# Patient Record
Sex: Male | Born: 1995 | Race: White | Hispanic: No | Marital: Married | Smoking: Never smoker
Health system: Southern US, Community
[De-identification: ages and names within clinical notes are randomized; demographics above are authoritative.]

## PROBLEM LIST (undated history)

## (undated) ENCOUNTER — Emergency Department: Discharge status: Absent without leave | Payer: 59

---
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
## 2013-12-20 ENCOUNTER — Ambulatory Visit: Payer: Self-pay | Admitting: Emergency Medicine

## 2013-12-20 LAB — RAPID INFLUENZA A&B ANTIGENS (ARMC ONLY)

## 2013-12-20 LAB — RAPID STREP-A WITH REFLX: Micro Text Report: NEGATIVE

## 2013-12-22 LAB — BETA STREP CULTURE(ARMC)

## 2015-12-16 ENCOUNTER — Encounter: Payer: Self-pay | Admitting: *Deleted

## 2015-12-16 ENCOUNTER — Ambulatory Visit
Admission: EM | Admit: 2015-12-16 | Discharge: 2015-12-16 | Disposition: A | Discharge status: Home / Self Care | Payer: 59 | Attending: Family Medicine | Admitting: Family Medicine

## 2015-12-16 DIAGNOSIS — J019 Acute sinusitis, unspecified: Secondary | ICD-10-CM

## 2015-12-16 DIAGNOSIS — H6593 Unspecified nonsuppurative otitis media, bilateral: Secondary | ICD-10-CM

## 2015-12-16 LAB — CBC WITH DIFFERENTIAL/PLATELET
BASOS ABS: 0 10*3/uL (ref 0–0.1)
Basophils Relative: 1 %
Eosinophils Absolute: 0.2 10*3/uL (ref 0–0.7)
Eosinophils Relative: 3 %
HCT: 45.5 % (ref 40.0–52.0)
HEMOGLOBIN: 15.7 g/dL (ref 13.0–18.0)
LYMPHS PCT: 13 %
Lymphs Abs: 1 10*3/uL (ref 1.0–3.6)
MCH: 30.3 pg (ref 26.0–34.0)
MCHC: 34.5 g/dL (ref 32.0–36.0)
MCV: 87.6 fL (ref 80.0–100.0)
MONO ABS: 1.2 10*3/uL — AB (ref 0.2–1.0)
Monocytes Relative: 16 %
NEUTROS ABS: 5.1 10*3/uL (ref 1.4–6.5)
NEUTROS PCT: 67 %
Platelets: 202 10*3/uL (ref 150–440)
RBC: 5.19 MIL/uL (ref 4.40–5.90)
RDW: 13 % (ref 11.5–14.5)
WBC: 7.6 10*3/uL (ref 3.8–10.6)

## 2015-12-16 LAB — RAPID STREP SCREEN (MED CTR MEBANE ONLY): STREPTOCOCCUS, GROUP A SCREEN (DIRECT): NEGATIVE

## 2015-12-16 LAB — MONONUCLEOSIS SCREEN: MONO SCREEN: NEGATIVE

## 2015-12-16 MED ORDER — PSEUDOEPHEDRINE HCL 30 MG PO TABS: 30.0000 mg | ORAL_TABLET | ORAL | Status: AC | PRN

## 2015-12-16 MED ORDER — SALINE SPRAY 0.65 % NA SOLN: 2.0000 | NASAL | Status: DC

## 2015-12-16 MED ORDER — CETIRIZINE HCL 10 MG PO CHEW: 10.0000 mg | CHEWABLE_TABLET | Freq: Every day | ORAL | Status: DC

## 2015-12-16 MED ORDER — AMOXICILLIN-POT CLAVULANATE 875-125 MG PO TABS: 1.0000 | ORAL_TABLET | Freq: Two times a day (BID) | ORAL | Status: AC

## 2015-12-16 MED ORDER — FLUTICASONE PROPIONATE 50 MCG/ACT NA SUSP: 1.0000 | Freq: Two times a day (BID) | NASAL | Status: DC

## 2015-12-16 NOTE — ED Notes (Signed)
Pt states that he has sinus congestion, sore throat, fatigued and facial pain that started about 4 days ago.

## 2015-12-16 NOTE — ED Provider Notes (Signed)
CSN: [id number]     Arrival date & time 12/16/15  1118 History   First MD Initiated Contact with Patient 12/16/15 1145     Chief Complaint  Patient presents with  . Nasal Congestion   (Consider location/radiation/quality/duration/timing/severity/associated sxs/prior Treatment) HPI Comments: Single Caucasian male has tried sudafed, allegra d, benadryl and nasal spray without any relief of symptoms.  Nasacort hasn't helped in the past.  2 doses afrin bedtime  helps with congestion.  + headache occiput to crown, feels like eyes swollen, post nasal drip, mild rare cough, nausea +  Goes to [employer]  Has noticed recurrent rhinitis this past year but previously denied seasonal allergies.  PSHx: denied  FHx mother seasonal allergies  The history is provided by the patient and a friend. No language interpreter was used.    History reviewed. No pertinent past medical history. History reviewed. No pertinent past surgical history. History reviewed. No pertinent family history. Social History  Substance Use Topics  . Smoking status: Never Smoker   . Smokeless tobacco: None  . Alcohol Use: No    Review of Systems  Constitutional: Positive for fatigue. Negative for fever, chills, diaphoresis, activity change, appetite change and unexpected weight change.  HENT: Positive for congestion, postnasal drip, rhinorrhea, sinus pressure and sore throat. Negative for dental problem, drooling, ear discharge, ear pain, facial swelling, hearing loss, mouth sores, nosebleeds, sneezing, tinnitus, trouble swallowing and voice change.   Eyes: Negative for photophobia, pain, discharge, redness, itching and visual disturbance.  Respiratory: Positive for cough. Negative for choking, chest tightness, shortness of breath, wheezing and stridor.   Cardiovascular: Negative for chest pain, palpitations and leg swelling.  Gastrointestinal: Positive for nausea. Negative for vomiting, abdominal pain, diarrhea,  constipation, blood in stool and abdominal distention.  Endocrine: Negative for cold intolerance and heat intolerance.  Genitourinary: Negative for dysuria.  Musculoskeletal: Negative for myalgias, back pain, joint swelling, arthralgias, gait problem, neck pain and neck stiffness.  Skin: Negative for color change, pallor, rash and wound.  Allergic/Immunologic: Positive for environmental allergies. Negative for food allergies and immunocompromised state.  Neurological: Negative for dizziness, tremors, seizures, syncope, facial asymmetry, speech difficulty, weakness, light-headedness, numbness and headaches.  Hematological: Negative for adenopathy. Does not bruise/bleed easily.  Psychiatric/Behavioral: Negative for behavioral problems, confusion, sleep disturbance and agitation.    Allergies  Review of patient's allergies indicates not on file.  Home Medications   Prior to Admission medications   Medication Sig Start Date End Date Taking? Authorizing Provider  amoxicillin-clavulanate (AUGMENTIN) 875-125 MG tablet Take 1 tablet by mouth every 12 (twelve) hours. 12/19/15 12/28/15  Barbaraann Barthel, NP  cetirizine (ZYRTEC) 10 MG chewable tablet Chew 1 tablet (10 mg total) by mouth daily. 12/16/15 12/29/15  Barbaraann Barthel, NP  fluticasone (FLONASE) 50 MCG/ACT nasal spray Place 1 spray into both nostrils 2 (two) times daily. 12/16/15   Barbaraann Barthel, NP  pseudoephedrine (SUDAFED) 30 MG tablet Take 1 tablet (30 mg total) by mouth every 4 (four) hours as needed for congestion (max  or 8 tabs per 24 hours). 12/16/15 12/22/15  Barbaraann Barthel, NP  sodium chloride (OCEAN) 0.65 % SOLN nasal spray Place 2 sprays into both nostrils every 2 (two) hours while awake. 12/16/15   Barbaraann Barthel, NP   Meds Ordered and Administered this Visit  Medications - No data to display  BP 130/85 mmHg  Pulse 91  Temp(Src) 98 F (36.7 C) (Oral)  Ht  (1.905 m)  Wt 250 lb (113.399 kg)  BMI 31.25 kg/m2   SpO2 99% No data found.   Physical Exam  Constitutional: He is oriented to person, place, and time. Vital signs are normal. He appears well-developed and well-nourished. He is active and cooperative.  Non-toxic appearance. He does not have a sickly appearance. He appears ill. No distress.  HENT:  Head: Normocephalic and atraumatic.  Right Ear: Hearing, external ear and ear canal normal. A middle ear effusion is present.  Left Ear: Hearing, external ear and ear canal normal. A middle ear effusion is present.  Nose: Mucosal edema and rhinorrhea present. No nose lacerations, sinus tenderness, nasal deformity, septal deviation or nasal septal hematoma. No epistaxis.  No foreign bodies. Right sinus exhibits no maxillary sinus tenderness and no frontal sinus tenderness. Left sinus exhibits no maxillary sinus tenderness and no frontal sinus tenderness.  Mouth/Throat: Uvula is midline and mucous membranes are normal. Mucous membranes are not pale, not dry and not cyanotic. He does not have dentures. No oral lesions. No trismus in the jaw. Normal dentition. No dental abscesses, uvula swelling, lacerations or dental caries. Posterior oropharyngeal edema and posterior oropharyngeal erythema present. No oropharyngeal exudate or tonsillar abscesses.  Cobblestoning posterior pharynx, bilater tms with air fluid level clear, bilateral nasal turbinates with edema erythema clear discarge, nasal voice  Eyes: Conjunctivae, EOM and lids are normal. Pupils are equal, round, and reactive to light. Right eye exhibits no chemosis, no discharge, no exudate and no hordeolum. No foreign body present in the right eye. Left eye exhibits no chemosis, no discharge, no exudate and no hordeolum. No foreign body present in the left eye. Right conjunctiva is not injected. Right conjunctiva has no hemorrhage. Left conjunctiva is not injected. Left conjunctiva has no hemorrhage. No scleral icterus. Right eye exhibits normal extraocular  motion and no nystagmus. Left eye exhibits normal extraocular motion and no nystagmus. Right pupil is round and reactive. Left pupil is round and reactive. Pupils are equal.  Neck: Trachea normal and normal range of motion. Neck supple. No tracheal tenderness, no spinous process tenderness and no muscular tenderness present. No rigidity. No tracheal deviation, no edema, no erythema and normal range of motion present. No thyroid mass and no thyromegaly present.  Cardiovascular: Normal rate, regular rhythm, S1 normal, S2 normal, normal heart sounds and intact distal pulses.  PMI is not displaced.  Exam reveals no gallop and no friction rub.   No murmur heard. Pulmonary/Chest: Effort normal and breath sounds normal. No stridor. No respiratory distress. He has no decreased breath sounds. He has no wheezes. He has no rhonchi. He has no rales.  Abdominal: Soft. He exhibits no shifting dullness, no distension, no pulsatile liver, no fluid wave, no abdominal bruit, no ascites, no pulsatile midline mass and no mass. Bowel sounds are decreased. There is no hepatosplenomegaly. There is no tenderness. There is no rigidity, no rebound, no guarding, no tenderness at McBurney's point and negative Murphy's sign. Hernia confirmed negative in the ventral area.  Dull to percussion x 4 quads  Musculoskeletal: Normal range of motion. He exhibits no edema or tenderness.  Lymphadenopathy:       Head (right side): No submental, no submandibular, no tonsillar, no preauricular, no posterior auricular and no occipital adenopathy present.       Head (left side): No submental, no submandibular, no tonsillar, no preauricular, no posterior auricular and no occipital adenopathy present.    He has no cervical adenopathy.  Right cervical: No superficial cervical, no deep cervical and no posterior cervical adenopathy present.      Left cervical: No superficial cervical, no deep cervical and no posterior cervical adenopathy present.   Neurological: He is alert and oriented to person, place, and time. He displays no atrophy and no tremor. No cranial nerve deficit or sensory deficit. He exhibits normal muscle tone. He displays no seizure activity. Coordination and gait normal. GCS eye subscore is 4. GCS verbal subscore is 5. GCS motor subscore is 6.  Skin: Skin is warm, dry and intact. No abrasion, no bruising, no burn, no ecchymosis, no laceration, no lesion, no petechiae and no rash noted. He is not diaphoretic. No cyanosis or erythema. No pallor. Nails show no clubbing.  Psychiatric: He has a normal mood and affect. His speech is normal and behavior is normal. Judgment and thought content normal. Cognition and memory are normal.  Nursing note and vitals reviewed.   ED Course  Procedures (including critical care time)  Labs Review Labs Reviewed  CBC WITH DIFFERENTIAL/PLATELET - Abnormal; Notable for the following:    Monocytes Absolute 1.2 (*)    All other components within normal limits  RAPID STREP SCREEN (NOT AT [facility])  CULTURE, GROUP A STREP [facility])  MONONUCLEOSIS SCREEN    Imaging Review No results found.  1245 discussed rapid strep, mononucleosis negative and CBC with slightly elevated monocytes.  Throat culture pending 48 hours will call with results once available.  Patient given copy of lab printouts.  Discussed rest, hydration.  Start augmentin if worsening symptoms, fever greater than 100.55F, eye pain, teeth pain, ear discharge. Patient refused school excuse.  Patient verbalized understanding of information/instructions, agreed with plan of care and had no further questions at this time.  MDM   1. Acute rhinosinusitis   2. Otitis media with effusion, bilateral    Supportive treatment.   No evidence of invasive bacterial infection, non toxic and well hydrated.  This is most likely self limiting viral infection.  I do not see where any further testing or imaging is necessary at this time.   I will suggest  supportive care, rest, good hygiene and encourage the patient to take adequate fluids.  The patient is to return to clinic or EMERGENCY ROOM if symptoms worsen or change significantly e.g. ear pain, fever, purulent discharge from ears or bleeding.  Exitcare handout on otitis media with effusion given to patient.  Patient verbalized agreement and understanding of treatment plan.    Patient notified rapid strep negative.  Suspect Viral illness: no evidence of invasive bacterial infection, non toxic and well hydrated.  This is most likely self limiting viral infection.  I do not see where any further testing or imaging is necessary at this time.   I will suggest supportive care, rest, good hygiene and encourage the patient to take adequate fluids.  Does not require work excuse.  Notified patient staff will call with culture results once available next 48+ hours.  Sudafed  po q4-6h prn; flonase 1 spray each nostril BID prn, nasal saline 1-2 sprays each nostril prn q2h, motrin  po TID prn.  Discussed honey with lemon and salt water gargles for comfort also.   Discussed maximum use afrin in 30 days is 3 days use (6 doses) as rebound swelling possible.  Recommended flonase and saline instead.  The patient is to return to clinic or EMERGENCY ROOM if symptoms worsen or change significantly e.g. fever, lethargy, SOB, wheezing.  Exitcare handout  on rhinitis given to patient.  Patient verbalized agreement and understanding of treatment plan.    No evidence of systemic bacterial infection, non toxic and well hydrated.  I do not see where any further testing or imaging is necessary at this time.   I will suggest supportive care, rest, good hygiene and encourage the patient to take adequate fluids.  The patient is to return to clinic or EMERGENCY ROOM if symptoms worsen or change significantly.  Exitcare handout on sinusitis given to patient.  Patient verbalized agreement and understanding of treatment plan and had no  further questions at this time.   P2:  Hand washing and cover cough  I have recommended clear fluids and bland diet.  Avoid dairy/spicy, fried and large portions of meat while having nausea.  If vomiting hold po intake x 1 hour.  Then sips clear fluids like broths, ginger ale, power ade, gatorade, pedialyte may advance to soft/bland if no vomiting x 24 hours and appetite returned otherwise hydration main focus.     Return to the clinic if symptoms persist or worsen; I have alerted the patient to call if high fever, dehydration, marked weakness, fainting, increased abdominal pain, blood in stool or vomit (red or black).   Exitcare handout on gastroenteritis given to patient. Patient verbalized agreement and understanding of treatment plan and had no further questions at this time.  Barbaraann Barthel, NP 12/16/15 1302

## 2015-12-16 NOTE — Discharge Instructions (Signed)
Do not use afrin for longer than 72 hours as rebound swelling possible  Otitis Media With Effusion Otitis media with effusion is the presence of fluid in the middle ear. This is a common problem in children, which often follows ear infections. It may be present for weeks or longer after the infection. Unlike an acute ear infection, otitis media with effusion refers only to fluid behind the ear drum and not infection. Children with repeated ear and sinus infections and allergy problems are the most likely to get otitis media with effusion. CAUSES  The most frequent cause of the fluid buildup is dysfunction of the eustachian tubes. These are the tubes that drain fluid in the ears to the back of the nose (nasopharynx). SYMPTOMS   The main symptom of this condition is hearing loss. As a result, you or your child may:  Listen to the TV at a loud volume.  Not respond to questions.  Ask "what" often when spoken to.  Mistake or confuse one sound or word for another.  There may be a sensation of fullness or pressure but usually not pain. DIAGNOSIS   Your health care provider will diagnose this condition by examining you or your child's ears.  Your health care provider may test the pressure in you or your child's ear with a tympanometer.  A hearing test may be conducted if the problem persists. TREATMENT   Treatment depends on the duration and the effects of the effusion.  Antibiotics, decongestants, nose drops, and cortisone-type drugs (tablets or nasal spray) may not be helpful.  Children with persistent ear effusions may have delayed language or behavioral problems. Children at risk for developmental delays in hearing, learning, and speech may require referral to a specialist earlier than children not at risk.  You or your child's health care provider may suggest a referral to an ear, nose, and throat surgeon for treatment. The following may help restore normal hearing:  Drainage of  fluid.  Placement of ear tubes (tympanostomy tubes).  Removal of adenoids (adenoidectomy). HOME CARE INSTRUCTIONS   Avoid secondhand smoke.  Infants who are breastfed are less likely to have this condition.  Avoid feeding infants while they are lying flat.  Avoid known environmental allergens.  Avoid people who are sick. SEEK MEDICAL CARE IF:   Hearing is not better in 3 months.  Hearing is worse.  Ear pain.  Drainage from the ear.  Dizziness. MAKE SURE YOU:   Understand these instructions.  Will watch your condition.  Will get help right away if you are not doing well or get worse.   This information is not intended to replace advice given to you by your health care provider. Make sure you discuss any questions you have with your health care provider.   Document Released: 12/11/2004 Document Revised: 11/24/2014 Document Reviewed: 05/31/2013 Elsevier Interactive Patient Education 2016 [employer] Hay Fever Hay fever is an allergic reaction to particles in the air. It cannot be passed from person to person. It cannot be cured, but it can be controlled. CAUSES  Hay fever is caused by something that triggers an allergic reaction (allergens). The following are examples of allergens:  Ragweed.  Feathers.  Animal dander.  Grass and tree pollens.  Cigarette smoke.  House dust.  Pollution. SYMPTOMS   Sneezing.  Runny or stuffy nose.  Tearing eyes.  Itchy eyes, nose, mouth, throat, skin, or other area.  Sore throat.  Headache.  Decreased sense of smell or taste. DIAGNOSIS Your  caregiver will perform a physical exam and ask questions about the symptoms you are having.Allergy testing may be done to determine exactly what triggers your hay fever.  TREATMENT   Over-the-counter medicines may help symptoms. These include:  Antihistamines.  Decongestants. These may help with nasal congestion.  Your caregiver may prescribe medicines if  over-the-counter medicines do not work.  Some people benefit from allergy shots when other medicines are not helpful. HOME CARE INSTRUCTIONS   Avoid the allergen that is causing your symptoms, if possible.  Take all medicine as told by your caregiver. SEEK MEDICAL CARE IF:   You have severe allergy symptoms and your current medicines are not helping.  Your treatment was working at one time, but you are now experiencing symptoms.  You have sinus congestion and pressure.  You develop a fever or headache.  You have thick nasal discharge.  You have asthma and have a worsening cough and wheezing. SEEK IMMEDIATE MEDICAL CARE IF:   You have swelling of your tongue or lips.  You have trouble breathing.  You feel lightheaded or like you are going to faint.  You have cold sweats.  You have a fever.   This information is not intended to replace advice given to you by your health care provider. Make sure you discuss any questions you have with your health care provider.   Document Released: 11/03/2005 Document Revised: 01/26/2012 Document Reviewed: 05/16/2015 Elsevier Interactive Patient Education 2016 [employer] Sinusitis, Adult Sinusitis is redness, soreness, and inflammation of the paranasal sinuses. Paranasal sinuses are air pockets within the bones of your face. They are located beneath your eyes, in the middle of your forehead, and above your eyes. In healthy paranasal sinuses, mucus is able to drain out, and air is able to circulate through them by way of your nose. However, when your paranasal sinuses are inflamed, mucus and air can become trapped. This can allow bacteria and other germs to grow and cause infection. Sinusitis can develop quickly and last only a short time (acute) or continue over a long period (chronic). Sinusitis that lasts for more than 12 weeks is considered chronic. CAUSES Causes of sinusitis include:  Allergies.  Structural abnormalities, such as  displacement of the cartilage that separates your nostrils (deviated septum), which can decrease the air flow through your nose and sinuses and affect sinus drainage.  Functional abnormalities, such as when the small hairs (cilia) that line your sinuses and help remove mucus do not work properly or are not present. SIGNS AND SYMPTOMS Symptoms of acute and chronic sinusitis are the same. The primary symptoms are pain and pressure around the affected sinuses. Other symptoms include:  Upper toothache.  Earache.  Headache.  Bad breath.  Decreased sense of smell and taste.  A cough, which worsens when you are lying flat.  Fatigue.  Fever.  Thick drainage from your nose, which often is green and may contain pus (purulent).  Swelling and warmth over the affected sinuses. DIAGNOSIS Your health care provider will perform a physical exam. During your exam, your health care provider may perform any of the following to help determine if you have acute sinusitis or chronic sinusitis:  Look in your nose for signs of abnormal growths in your nostrils (nasal polyps).  Tap over the affected sinus to check for signs of infection.  View the inside of your sinuses using an imaging device that has a light attached (endoscope). If your health care provider suspects that you have chronic sinusitis,  one or more of the following tests may be recommended:  Allergy tests.  Nasal culture. A sample of mucus is taken from your nose, sent to a lab, and screened for bacteria.  Nasal cytology. A sample of mucus is taken from your nose and examined by your health care provider to determine if your sinusitis is related to an allergy. TREATMENT Most cases of acute sinusitis are related to a viral infection and will resolve on their own within 10 days. Sometimes, medicines are prescribed to help relieve symptoms of both acute and chronic sinusitis. These may include pain medicines, decongestants, nasal steroid  sprays, or saline sprays. However, for sinusitis related to a bacterial infection, your health care provider will prescribe antibiotic medicines. These are medicines that will help kill the bacteria causing the infection. Rarely, sinusitis is caused by a fungal infection. In these cases, your health care provider will prescribe antifungal medicine. For some cases of chronic sinusitis, surgery is needed. Generally, these are cases in which sinusitis recurs more than 3 times per year, despite other treatments. HOME CARE INSTRUCTIONS  Drink plenty of water. Water helps thin the mucus so your sinuses can drain more easily.  Use a humidifier.  Inhale steam 3-4 times a day (for example, sit in the bathroom with the shower running).  Apply a warm, moist washcloth to your face 3-4 times a day, or as directed by your health care provider.  Use saline nasal sprays to help moisten and clean your sinuses.  Take medicines only as directed by your health care provider.  If you were prescribed either an antibiotic or antifungal medicine, finish it all even if you start to feel better. SEEK IMMEDIATE MEDICAL CARE IF:  You have increasing pain or severe headaches.  You have nausea, vomiting, or drowsiness.  You have swelling around your face.  You have vision problems.  You have a stiff neck.  You have difficulty breathing.   This information is not intended to replace advice given to you by your health care provider. Make sure you discuss any questions you have with your health care provider.   Document Released: 11/03/2005 Document Revised: 11/24/2014 Document Reviewed: 11/18/2011 Elsevier Interactive Patient Education [employer]

## 2015-12-18 LAB — CULTURE, GROUP A STREP (THRC)

## 2015-12-20 ENCOUNTER — Telehealth: Payer: Self-pay | Admitting: Family Medicine

## 2015-12-20 NOTE — Telephone Encounter (Signed)
Telephone message left for patient throat culture normal/negative for strep.  Patient to contact clinic if further questions or concerns

## 2016-10-22 DIAGNOSIS — Z23 Encounter for immunization: Secondary | ICD-10-CM | POA: Diagnosis not present

## 2016-12-21 ENCOUNTER — Encounter: Payer: Self-pay | Admitting: Emergency Medicine

## 2016-12-21 ENCOUNTER — Ambulatory Visit
Admission: EM | Admit: 2016-12-21 | Discharge: 2016-12-21 | Disposition: A | Discharge status: Home / Self Care | Payer: 59 | Attending: Emergency Medicine | Admitting: Emergency Medicine

## 2016-12-21 DIAGNOSIS — K1120 Sialoadenitis, unspecified: Secondary | ICD-10-CM | POA: Diagnosis not present

## 2016-12-21 LAB — RAPID STREP SCREEN (MED CTR MEBANE ONLY): STREPTOCOCCUS, GROUP A SCREEN (DIRECT): NEGATIVE

## 2016-12-21 MED ORDER — CEFTRIAXONE SODIUM 1 G IJ SOLR
1.0000 g | Freq: Once | INTRAMUSCULAR | Status: AC
Start: 2016-12-21 — End: 2016-12-21
  Administered 2016-12-21: 1 g via INTRAMUSCULAR

## 2016-12-21 MED ORDER — AMOXICILLIN-POT CLAVULANATE 875-125 MG PO TABS: 1.0000 | ORAL_TABLET | Freq: Two times a day (BID) | ORAL | 0 refills | Status: AC

## 2016-12-21 MED ORDER — ACETAMINOPHEN 500 MG PO TABS
1000.0000 mg | ORAL_TABLET | Freq: Once | ORAL | Status: AC
  Administered 2016-12-21: 1000 mg via ORAL

## 2016-12-21 MED ORDER — IBUPROFEN 800 MG PO TABS: 800.0000 mg | ORAL_TABLET | Freq: Three times a day (TID) | ORAL | 0 refills | Status: AC

## 2016-12-21 NOTE — Discharge Instructions (Signed)
800 mg of ibuprofen with 1 g of Tylenol 3 times a day as needed for pain, fever and swelling. Drink plenty of extra fluids. Try sucking on sour candy such as lemon drops or sour patch kids. Follow-up with one of the ear  nose and throat specialists in several days if not getting better. Go to the ER if you get worse.

## 2016-12-21 NOTE — ED Triage Notes (Signed)
Patient c/o swollen and tender pain on the right side of his neck since yesterday.  Patient denies cold symptoms at this time.

## 2016-12-21 NOTE — ED Provider Notes (Signed)
HPI  SUBJECTIVE:  Travis Vargas is a 21 y.o. male who presents with constant dull pain and swelling inferior to his right jaw starting yesterday. He denies fevers at home but has a fever here. He tried Aleve last night. Symptoms are worse with opening his mouth and swallowing, no alleviating factors. He states that he was "sick last weekend" with flu/cold symptoms, but has been well for the past week. He denies nasal congestion, sinus pain or pressure, rhinorrhea, post nasal drip, dental pain, sore throat, voice changes, neck stiffness, ear pain. The swelling is not associated with eating or salivating. No antipyretic in the past 6-8 hours. He has never had symptoms like this before. He has no history of diabetes, hypertension, immunocompromise. PMD: Dr. Judithann Graves.  History reviewed. No pertinent past medical history.  History reviewed. No pertinent surgical history.  History reviewed. No pertinent family history.  Social History  Substance Use Topics  . Smoking status: Never Smoker  . Smokeless tobacco: Never Used  . Alcohol use No     Current Facility-Administered Medications:  .  cefTRIAXone (ROCEPHIN) injection 1 g, 1 g, Intramuscular, Once, Domenick Gong, MD  Current Outpatient Prescriptions:  .  amoxicillin-clavulanate (AUGMENTIN) 875-125 MG tablet, Take 1 tablet by mouth 2 (two) times daily., Disp: 20 tablet, Rfl: 0 .  ibuprofen (ADVIL,MOTRIN) 800 MG tablet, Take 1 tablet (800 mg total) by mouth 3 (three) times daily., Disp: 30 tablet, Rfl: 0  No Known Allergies   ROS  As noted in HPI.   Physical Exam  BP 125/70 (BP Location: Left Arm)   Pulse (!) 104   Temp (!) 101.2 F (38.4 C) (Oral)   Resp 16   Ht 6\' 3"  (1.905 m)   Wt 250 lb (113.4 kg)   SpO2 99%   BMI 31.25 kg/m   Constitutional: Well developed, well nourished, no acute distress Eyes:  EOMI, conjunctiva normal bilaterally HENT: Normocephalic, atraumatic,mucus membranes moist. Dentition, gingiva  normal, nontender. No expressible purulent drainage from wharton or Stensen's duct. No parotid gland tenderness, swelling. Positive facial swelling, tender mass inferior to the right jaw. No trismus. Normal voice.Tonsils normal, uvula midline. No drooling, stridor  Neck: No neck stiffness, cervical lymphadenopathy Respiratory: Normal inspiratory effort Cardiovascular: Normal rate GI: nondistended skin: No rash, skin intact Musculoskeletal: no deformities Neurologic: Alert & oriented x 3, no focal neuro deficits Psychiatric: Speech and behavior appropriate   ED Course   Medications  cefTRIAXone (ROCEPHIN) injection 1 g (not administered)  acetaminophen (TYLENOL) tablet 1,000 mg (1,000 mg Oral Given 12/21/16 0958)    Orders Placed This Encounter  Procedures  . Rapid strep screen    Standing Status:   Standing    Number of Occurrences:   1  . Culture, group A strep    Standing Status:   Standing    Number of Occurrences:   1    Results for orders placed or performed during the hospital encounter of 12/21/16 (from the past 24 hour(s))  Rapid strep screen     Status: None   Collection Time: 12/21/16  9:51 AM  Result Value Ref Range   Streptococcus, Group A Screen (Direct) NEGATIVE NEGATIVE   No results found.  ED Clinical Impression  Sialadenitis  ED Assessment/Plan  Pt febrile,but appears nontoxic, airway widely patent. Rapid strep negative. Does not appear to be ludwigs angina, Retropharyngeal abscess, buccal abscess, mumps. Does not involve parotid gland. It does not appear to be a dental infection.  Concern for infected submandibular  salivary gland with stone although unable to appreciate stone in ducts on exam or expressible purulent drainage.  Since  patient has fever, plan to give him a gram of Rocephin, home and ibuprofen 800 mg 1 g of Tylenol 3 times a day, Augmentin 875 mg by mouth twice a day for 10 days and will provide follow-up with ENT Dr, Andee PolesVaught on call or Dr.  Elenore RotaJuengel.. Discussed MDM, plan and followup with patient. Discussed sn/sx that should prompt return to the ED. Patient  agrees with plan.   Meds ordered this encounter  Medications  . acetaminophen (TYLENOL) tablet 1,000 mg  . cefTRIAXone (ROCEPHIN) injection 1 g  . amoxicillin-clavulanate (AUGMENTIN) 875-125 MG tablet    Sig: Take 1 tablet by mouth 2 (two) times daily.    Dispense:  20 tablet    Refill:  0  . ibuprofen (ADVIL,MOTRIN) 800 MG tablet    Sig: Take 1 tablet (800 mg total) by mouth 3 (three) times daily.    Dispense:  30 tablet    Refill:  0    *This clinic note was created using Scientist, clinical (histocompatibility and immunogenetics)Dragon dictation software. Therefore, there may be occasional mistakes despite careful proofreading.  ?   Domenick GongAshley Avia Merkley, MD 12/21/16 1739

## 2016-12-22 DIAGNOSIS — K1121 Acute sialoadenitis: Secondary | ICD-10-CM | POA: Diagnosis not present

## 2016-12-24 LAB — CULTURE, GROUP A STREP (THRC)

## 2017-10-28 DIAGNOSIS — H5213 Myopia, bilateral: Secondary | ICD-10-CM | POA: Diagnosis not present

## 2018-09-10 DIAGNOSIS — Z23 Encounter for immunization: Secondary | ICD-10-CM | POA: Diagnosis not present

## 2019-09-27 DIAGNOSIS — Z23 Encounter for immunization: Secondary | ICD-10-CM | POA: Diagnosis not present

## 2019-10-10 ENCOUNTER — Ambulatory Visit
Admission: EM | Admit: 2019-10-10 | Discharge: 2019-10-10 | Disposition: A | Discharge status: Home / Self Care | Payer: 59 | Attending: Family Medicine | Admitting: Family Medicine

## 2019-10-10 ENCOUNTER — Other Ambulatory Visit: Payer: Self-pay

## 2019-10-10 DIAGNOSIS — R0981 Nasal congestion: Secondary | ICD-10-CM

## 2019-10-10 DIAGNOSIS — B349 Viral infection, unspecified: Secondary | ICD-10-CM | POA: Diagnosis not present

## 2019-10-10 DIAGNOSIS — R438 Other disturbances of smell and taste: Secondary | ICD-10-CM | POA: Diagnosis not present

## 2019-10-10 NOTE — ED Triage Notes (Signed)
Pt with nasal congestion starting on Tuesday. No headache, no sore throat, no fever. Saturday his congestion worsened and then couldn't taste or smell. Felt like head was very full.

## 2019-10-10 NOTE — Discharge Instructions (Addendum)
Rest, fluids, tylenol, over the counter medication as needed Await test result

## 2019-10-10 NOTE — ED Provider Notes (Signed)
[facility]    CSN: [id number] Arrival date & time: 10/10/19  1497      History   Chief Complaint Chief Complaint  Patient presents with  . Nasal Congestion    HPI Travis Vargas is a 23 y.o. male.   23 yo male with a c/o nasal congestion for the past 6 days and 3 days of loss of taste and smell. Denies any fevers, chills, chest pain, shortness of breath.      History reviewed. No pertinent past medical history.  There are no active problems to display for this patient.   History reviewed. No pertinent surgical history.     Home Medications    Prior to Admission medications   Medication Sig Start Date End Date Taking? Authorizing Provider  ibuprofen (ADVIL,MOTRIN) 800 MG tablet Take 1 tablet (800 mg total) by mouth 3 (three) times daily. 12/21/16   Domenick Gong, MD    Family History History reviewed. No pertinent family history.  Social History Social History   Tobacco Use  . Smoking status: Never Smoker  . Smokeless tobacco: Never Used  Substance Use Topics  . Alcohol use: Yes    Comment: social  . Drug use: No     Allergies   Patient has no known allergies.   Review of Systems Review of Systems   Physical Exam Triage Vital Signs ED Triage Vitals  Enc Vitals Group     BP 10/10/19 0903 (!) 144/89     Pulse Rate 10/10/19 0903 75     Resp 10/10/19 0903 17     Temp 10/10/19 0903 98.5 F (36.9 C)     Temp Source 10/10/19 0903 Oral     SpO2 10/10/19 0903 100 %     Weight 10/10/19 0905 270 lb (122.5 kg)     Height 10/10/19 0905 6\' 3"  (1.905 m)     Head Circumference --      Peak Flow --      Pain Score 10/10/19 0905 0     Pain Loc --      Pain Edu? --      Excl. in GC? --    No data found.  Updated Vital Signs BP (!) 144/89 (BP Location: Right Arm)   Pulse 75   Temp 98.5 F (36.9 C) (Oral)   Resp 17   Ht 6\' 3"  (1.905 m)   Wt 122.5 kg   SpO2 100%   BMI 33.75 kg/m   Visual Acuity Right Eye Distance:   Left  Eye Distance:   Bilateral Distance:    Right Eye Near:   Left Eye Near:    Bilateral Near:     Physical Exam Vitals signs and nursing note reviewed.  Constitutional:      General: He is not in acute distress.    Appearance: He is not diaphoretic.  Cardiovascular:     Rate and Rhythm: Normal rate.  Pulmonary:     Effort: Pulmonary effort is normal. No respiratory distress.  Neurological:     Mental Status: He is alert.      UC Treatments / Results  Labs (all labs ordered are listed, but only abnormal results are displayed) Labs Reviewed  NOVEL CORONAVIRUS, NAA (HOSP ORDER, SEND-OUT TO REF LAB; TAT 18-24 HRS)    EKG   Radiology No results found.  Procedures Procedures (including critical care time)  Medications Ordered in UC Medications - No data to display  Initial Impression / Assessment and Plan /  UC Course  I have reviewed the triage vital signs and the nursing notes.  Pertinent labs & imaging results that were available during my care of the patient were reviewed by me and considered in my medical decision making (see chart for details).      Final Clinical Impressions(s) / UC Diagnoses   Final diagnoses:  Viral syndrome     Discharge Instructions     Rest, fluids, tylenol, over the counter medication as needed Await test result    ED Prescriptions    None     1. diagnosis reviewed with patient 2. Recommend supportive treatment as above 3. covid test done 4. Follow-up prn if symptoms worsen or don't improve   PDMP not reviewed this encounter.   Norval Gable, MD 10/10/19 [phone]

## 2019-10-12 ENCOUNTER — Telehealth (HOSPITAL_COMMUNITY): Payer: Self-pay | Admitting: Emergency Medicine

## 2019-10-12 LAB — NOVEL CORONAVIRUS, NAA (HOSP ORDER, SEND-OUT TO REF LAB; TAT 18-24 HRS): SARS-CoV-2, NAA: DETECTED — AB

## 2019-10-12 NOTE — Telephone Encounter (Signed)
Positive covid detected on sample. Had extensive conversation about quarantine for himself and his partner. All questions answered.

## 2020-02-13 ENCOUNTER — Ambulatory Visit: Payer: 59 | Admitting: Internal Medicine

## 2020-02-13 ENCOUNTER — Other Ambulatory Visit: Payer: Self-pay

## 2020-02-13 ENCOUNTER — Encounter: Payer: Self-pay | Admitting: Internal Medicine

## 2020-02-13 VITALS — BP 118/70 | HR 70 | Temp 97.8°F | Ht 75.0 in | Wt 278.0 lb

## 2020-02-13 DIAGNOSIS — B356 Tinea cruris: Secondary | ICD-10-CM

## 2020-02-13 DIAGNOSIS — Z6834 Body mass index (BMI) 34.0-34.9, adult: Secondary | ICD-10-CM

## 2020-02-13 MED ORDER — CLOTRIMAZOLE-BETAMETHASONE 1-0.05 % EX CREA: 1.0000 "application " | TOPICAL_CREAM | Freq: Two times a day (BID) | CUTANEOUS | 2 refills | Status: AC

## 2020-02-13 MED ORDER — FLUCONAZOLE 100 MG PO TABS: 100.0000 mg | ORAL_TABLET | ORAL | 0 refills | Status: AC

## 2020-02-13 NOTE — Progress Notes (Signed)
Date:  02/13/2020   Name:  Travis Vargas.   DOB:  1996-06-10   MRN:  [id number]   Chief Complaint: Rash (Jock Itch. Itching. No pain. X 4-5 months. Tried treatments at home that is not helping.) and Establish Care (Re-establishing care. Has been seen in the past but has not been seen in many years.  ) He would like to schedule a CPX and have labs done.  He has no concerns other than a rash.  Rash This is a new problem. The current episode started more than 1 month ago. The problem has been waxing and waning since onset. The affected locations include the groin (and glueal cleft). The rash is characterized by itchiness and redness. He was exposed to nothing. Pertinent negatives include no cough, fatigue or shortness of breath. Treatments tried: jock itch cream. The treatment provided mild relief.    Lab Results  Component Value Date   WBC 7.6 12/16/2015   HGB 15.7 12/16/2015   HCT 45.5 12/16/2015   MCV 87.6 12/16/2015   PLT 202 12/16/2015    Review of Systems  Constitutional: Negative for appetite change, fatigue and unexpected weight change.  Respiratory: Negative for cough, shortness of breath and wheezing.   Cardiovascular: Negative for chest pain, palpitations and leg swelling.  Gastrointestinal: Negative for abdominal pain.  Genitourinary: Negative for dysuria and hematuria.  Skin: Positive for rash. Negative for color change.  Neurological: Negative for tremors, numbness and headaches.  Psychiatric/Behavioral: Negative for dysphoric mood. The patient is not nervous/anxious.     There are no problems to display for this patient.   No Known Allergies  History reviewed. No pertinent surgical history.  Social History   Tobacco Use  . Smoking status: Never Smoker  . Smokeless tobacco: Never Used  Substance Use Topics  . Alcohol use: Yes    Comment: social  . Drug use: No     Medication list has been reviewed and updated.  Current Meds  Medication Sig    . ibuprofen (ADVIL,MOTRIN) 800 MG tablet Take 1 tablet (800 mg total) by mouth 3 (three) times daily.    PHQ 2/9 Scores 02/13/2020  PHQ - 2 Score 0  PHQ- 9 Score 0    BP Readings from Last 3 Encounters:  02/13/20 118/70  10/10/19 (!) 144/89  12/21/16 125/70    Physical Exam Vitals and nursing note reviewed.  Constitutional:      General: He is not in acute distress.    Appearance: Normal appearance. He is well-developed.  HENT:     Head: Normocephalic and atraumatic.  Cardiovascular:     Rate and Rhythm: Normal rate and regular rhythm.     Pulses: Normal pulses.  Pulmonary:     Effort: Pulmonary effort is normal. No respiratory distress.     Breath sounds: No wheezing or rhonchi.  Musculoskeletal:     Cervical back: Normal range of motion.     Right lower leg: No edema.     Left lower leg: No edema.  Lymphadenopathy:     Cervical: No cervical adenopathy.  Skin:    General: Skin is warm and dry.     Capillary Refill: Capillary refill takes less than 2 seconds.     Findings: Rash present.     Comments: Not examined  Neurological:     Mental Status: He is alert and oriented to person, place, and time.  Psychiatric:        Behavior: Behavior normal.  Thought Content: Thought content normal.     Wt Readings from Last 3 Encounters:  02/13/20 278 lb (126.1 kg)  10/10/19 270 lb (122.5 kg)  12/21/16 250 lb (113.4 kg)    BP 118/70   Pulse 70   Temp 97.8 F (36.6 C) (Temporal)   Ht 6\' 3"  (1.905 m)   Wt 278 lb (126.1 kg)   SpO2 98%   BMI 34.75 kg/m   Assessment and Plan: 1. Tinea cruris - clotrimazole-betamethasone (LOTRISONE) cream; Apply 1 application topically 2 (two) times daily.  Dispense: 30 g; Refill: 2 - fluconazole (DIFLUCAN) 100 MG tablet; Take 1 tablet (100 mg total) by mouth every other day for 6 days.  Dispense: 3 tablet; Refill: 0  2. BMI 34.0-34.9,adult Continue efforts at diet and weight loss   Partially dictated using Editor, commissioning.  Any errors are unintentional.  Halina Maidens, MD [facility]  02/13/2020

## 2020-04-27 DIAGNOSIS — Z20828 Contact with and (suspected) exposure to other viral communicable diseases: Secondary | ICD-10-CM | POA: Diagnosis not present

## 2020-07-18 DIAGNOSIS — L308 Other specified dermatitis: Secondary | ICD-10-CM | POA: Diagnosis not present

## 2020-07-18 DIAGNOSIS — D485 Neoplasm of uncertain behavior of skin: Secondary | ICD-10-CM | POA: Diagnosis not present

## 2020-07-18 DIAGNOSIS — L578 Other skin changes due to chronic exposure to nonionizing radiation: Secondary | ICD-10-CM | POA: Diagnosis not present

## 2020-08-13 ENCOUNTER — Ambulatory Visit (INDEPENDENT_AMBULATORY_CARE_PROVIDER_SITE_OTHER): Payer: 59 | Admitting: Internal Medicine

## 2020-08-13 ENCOUNTER — Other Ambulatory Visit: Payer: Self-pay

## 2020-08-13 ENCOUNTER — Encounter: Payer: Self-pay | Admitting: Internal Medicine

## 2020-08-13 VITALS — BP 116/78 | HR 65 | Ht 75.0 in | Wt 267.0 lb

## 2020-08-13 DIAGNOSIS — Z0000 Encounter for general adult medical examination without abnormal findings: Secondary | ICD-10-CM | POA: Diagnosis not present

## 2020-08-13 DIAGNOSIS — Z1159 Encounter for screening for other viral diseases: Secondary | ICD-10-CM | POA: Diagnosis not present

## 2020-08-13 DIAGNOSIS — Z23 Encounter for immunization: Secondary | ICD-10-CM | POA: Diagnosis not present

## 2020-08-13 LAB — POCT URINALYSIS DIPSTICK
Bilirubin, UA: NEGATIVE
Blood, UA: NEGATIVE
Glucose, UA: NEGATIVE
Ketones, UA: NEGATIVE
Leukocytes, UA: NEGATIVE
Nitrite, UA: NEGATIVE
Protein, UA: NEGATIVE
Spec Grav, UA: 1.02 (ref 1.010–1.025)
Urobilinogen, UA: 0.2 E.U./dL
pH, UA: 5 (ref 5.0–8.0)

## 2020-08-13 NOTE — Progress Notes (Signed)
Date:  08/13/2020   Name:  Travis Vargas.   DOB:  1996-09-21   MRN:  [id number]   Chief Complaint: Annual Exam  Travis Vargas. is a 24 y.o. male who presents today for his Complete Annual Exam. He feels well. He reports exercising - running 3 times weekly. He reports he is sleeping fairly well.  He works for the [employer].  He feels that he not getting enough sleep - wakes feeling like he is tired.  No snoring or gasping/apneas.  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   Immunization History  Administered Date(s) Administered  . Influenza, Quadrivalent, Recombinant, Inj, Pf 09/27/2019  . PFIZER SARS-COV-2 Vaccination 02/03/2020, 02/24/2020    HPI  No results found for: CREATININE, BUN, NA, K, CL, CO2 No results found for: CHOL, HDL, LDLCALC, LDLDIRECT, TRIG, CHOLHDL No results found for: TSH No results found for: HGBA1C Lab Results  Component Value Date   WBC 7.6 12/16/2015   HGB 15.7 12/16/2015   HCT 45.5 12/16/2015   MCV 87.6 12/16/2015   PLT 202 12/16/2015   No results found for: ALT, AST, GGT, ALKPHOS, BILITOT   Review of Systems  Constitutional: Negative for appetite change, chills, diaphoresis, fatigue and unexpected weight change.  HENT: Negative for hearing loss, trouble swallowing and voice change.   Eyes: Negative for visual disturbance.  Respiratory: Negative for choking, shortness of breath and wheezing.   Cardiovascular: Negative for chest pain, palpitations and leg swelling.  Gastrointestinal: Negative for abdominal pain, blood in stool, constipation and diarrhea.  Genitourinary: Negative for difficulty urinating, dysuria and frequency.  Musculoskeletal: Negative for arthralgias, back pain and myalgias.  Skin: Negative for color change and rash.  Neurological: Negative for dizziness, syncope and headaches.  Hematological: Negative for adenopathy.    Psychiatric/Behavioral: Negative for dysphoric mood and sleep disturbance (due to work schedule).    Patient Active Problem List   Diagnosis Date Noted  . BMI 34.0-34.9,adult 02/13/2020    No Known Allergies  History reviewed. No pertinent surgical history.  Social History   Tobacco Use  . Smoking status: Never Smoker  . Smokeless tobacco: Never Used  Vaping Use  . Vaping Use: Never used  Substance Use Topics  . Alcohol use: Yes    Comment: social  . Drug use: No     Medication list has been reviewed and updated.  Current Meds  Medication Sig  . clotrimazole-betamethasone (LOTRISONE) cream Apply 1 application topically 2 (two) times daily.  Marland Kitchen ibuprofen (ADVIL,MOTRIN) 800 MG tablet Take 1 tablet (800 mg total) by mouth 3 (three) times daily.    PHQ 2/9 Scores 02/13/2020  PHQ - 2 Score 0  PHQ- 9 Score 0    No flowsheet data found.  BP Readings from Last 3 Encounters:  08/13/20 116/78  02/13/20 118/70  10/10/19 (!) 144/89    Physical Exam Vitals and nursing note reviewed.  Constitutional:      Appearance: Normal appearance. He is well-developed.  HENT:     Head: Normocephalic.     Right Ear: Tympanic membrane, ear canal and external ear normal.     Left Ear: Tympanic membrane, ear canal and external ear normal.     Nose: Nose normal.  Eyes:     Conjunctiva/sclera: Conjunctivae normal.     Pupils: Pupils are equal, round, and reactive to light.  Neck:  Thyroid: No thyromegaly.     Vascular: No carotid bruit.  Cardiovascular:     Rate and Rhythm: Normal rate and regular rhythm.     Pulses: Normal pulses.     Heart sounds: Normal heart sounds. No murmur heard.   Pulmonary:     Effort: Pulmonary effort is normal.     Breath sounds: Normal breath sounds. No wheezing.  Chest:     Breasts:        Right: No mass.        Left: No mass.  Abdominal:     General: Bowel sounds are normal.     Palpations: Abdomen is soft.     Tenderness: There is no  abdominal tenderness.  Musculoskeletal:        General: Normal range of motion.     Cervical back: Normal range of motion and neck supple.     Right lower leg: No edema.     Left lower leg: No edema.  Lymphadenopathy:     Cervical: No cervical adenopathy.  Skin:    General: Skin is warm and dry.     Capillary Refill: Capillary refill takes less than 2 seconds.  Neurological:     General: No focal deficit present.     Mental Status: He is alert and oriented to person, place, and time.     Deep Tendon Reflexes: Reflexes are normal and symmetric.  Psychiatric:        Mood and Affect: Mood normal.        Speech: Speech normal.        Behavior: Behavior normal.     Wt Readings from Last 3 Encounters:  08/13/20 267 lb (121.1 kg)  02/13/20 278 lb (126.1 kg)  10/10/19 270 lb (122.5 kg)    BP 116/78   Pulse 65   Ht 6\' 3"  (1.905 m)   Wt 267 lb (121.1 kg)   SpO2 98%   BMI 33.37 kg/m   Assessment and Plan: 1. Annual physical exam Continue healthy diet and exercise Has lost 9 lbs since last visit Needs continuous restful sleep but this is not possible due to work - CBC with Differential/Platelet - Comprehensive metabolic panel - Lipid panel - POCT urinalysis dipstick  2. Need for hepatitis C screening test - Hepatitis C antibody   Partially dictated using . Any errors are unintentional.  Animal nutritionist, MD [facility]  08/13/2020

## 2020-08-14 ENCOUNTER — Encounter: Payer: Self-pay | Admitting: Internal Medicine

## 2020-08-14 DIAGNOSIS — E782 Mixed hyperlipidemia: Secondary | ICD-10-CM | POA: Insufficient documentation

## 2020-08-14 LAB — COMPREHENSIVE METABOLIC PANEL
ALT: 21 IU/L (ref 0–44)
AST: 17 IU/L (ref 0–40)
Albumin/Globulin Ratio: 2 (ref 1.2–2.2)
Albumin: 4.9 g/dL (ref 4.1–5.2)
Alkaline Phosphatase: 84 IU/L (ref 44–121)
BUN/Creatinine Ratio: 8 — ABNORMAL LOW (ref 9–20)
BUN: 8 mg/dL (ref 6–20)
Bilirubin Total: 0.3 mg/dL (ref 0.0–1.2)
CO2: 25 mmol/L (ref 20–29)
Calcium: 9.7 mg/dL (ref 8.7–10.2)
Chloride: 102 mmol/L (ref 96–106)
Creatinine, Ser: 0.99 mg/dL (ref 0.76–1.27)
GFR calc Af Amer: 122 mL/min/{1.73_m2} (ref >59)
GFR calc non Af Amer: 105 mL/min/{1.73_m2} (ref >59)
Globulin, Total: 2.5 g/dL (ref 1.5–4.5)
Glucose: 102 mg/dL — ABNORMAL HIGH (ref 65–99)
Potassium: 4.4 mmol/L (ref 3.5–5.2)
Sodium: 140 mmol/L (ref 134–144)
Total Protein: 7.4 g/dL (ref 6.0–8.5)

## 2020-08-14 LAB — CBC WITH DIFFERENTIAL/PLATELET
Basophils Absolute: 0 10*3/uL (ref 0.0–0.2)
Basos: 1 %
EOS (ABSOLUTE): 0.1 10*3/uL (ref 0.0–0.4)
Eos: 3 %
Hematocrit: 50.5 % (ref 37.5–51.0)
Hemoglobin: 17.5 g/dL (ref 13.0–17.7)
Immature Grans (Abs): 0 10*3/uL (ref 0.0–0.1)
Immature Granulocytes: 0 %
Lymphocytes Absolute: 1.2 10*3/uL (ref 0.7–3.1)
Lymphs: 24 %
MCH: 31.4 pg (ref 26.6–33.0)
MCHC: 34.7 g/dL (ref 31.5–35.7)
MCV: 91 fL (ref 79–97)
Monocytes Absolute: 0.6 10*3/uL (ref 0.1–0.9)
Monocytes: 12 %
Neutrophils Absolute: 3 10*3/uL (ref 1.4–7.0)
Neutrophils: 60 %
Platelets: 260 10*3/uL (ref 150–450)
RBC: 5.57 x10E6/uL (ref 4.14–5.80)
RDW: 12.1 % (ref 11.6–15.4)
WBC: 4.9 10*3/uL (ref 3.4–10.8)

## 2020-08-14 LAB — LIPID PANEL
Chol/HDL Ratio: 6.4 ratio — ABNORMAL HIGH (ref 0.0–5.0)
Cholesterol, Total: 185 mg/dL (ref 100–199)
HDL: 29 mg/dL — ABNORMAL LOW (ref >39)
LDL Chol Calc (NIH): 127 mg/dL — ABNORMAL HIGH (ref 0–99)
Triglycerides: 161 mg/dL — ABNORMAL HIGH (ref 0–149)
VLDL Cholesterol Cal: 29 mg/dL (ref 5–40)

## 2020-08-14 LAB — HEPATITIS C ANTIBODY: Hep C Virus Ab: <0.1 s/co ratio (ref 0.0–0.9)

## 2020-11-21 DIAGNOSIS — L408 Other psoriasis: Secondary | ICD-10-CM | POA: Diagnosis not present

## 2020-11-27 ENCOUNTER — Other Ambulatory Visit: Payer: Self-pay

## 2020-11-27 ENCOUNTER — Ambulatory Visit (INDEPENDENT_AMBULATORY_CARE_PROVIDER_SITE_OTHER): Payer: 59 | Admitting: Internal Medicine

## 2020-11-27 ENCOUNTER — Encounter: Payer: Self-pay | Admitting: Internal Medicine

## 2020-11-27 VITALS — BP 106/86 | HR 72 | Temp 98.0°F | Ht 75.0 in | Wt 275.0 lb

## 2020-11-27 DIAGNOSIS — S46912A Strain of unspecified muscle, fascia and tendon at shoulder and upper arm level, left arm, initial encounter: Secondary | ICD-10-CM | POA: Diagnosis not present

## 2020-11-27 NOTE — Progress Notes (Signed)
Date:  11/27/2020   Name:  Travis Vargas.   DOB:  1996-01-23   MRN:  [id number]   Chief Complaint: Shoulder Pain (X1 day, left shoulder, constant pain, loading cabinets into a house yesterday, hurts to move arm up )  Shoulder Injury  The incident occurred at home. The left shoulder is affected. The incident occurred 12 to 24 hours ago. Injury mechanism: carrying heavy cabinets. The quality of the pain is described as aching. The pain does not radiate. The pain is moderate. Associated symptoms include tingling (during the night while sleeping on his back). Pertinent negatives include no chest pain, muscle weakness or numbness. He has tried ice for the symptoms. The treatment provided mild relief.    Lab Results  Component Value Date   CREATININE 0.99 08/13/2020   BUN 8 08/13/2020   NA 140 08/13/2020   K 4.4 08/13/2020   CL 102 08/13/2020   CO2 25 08/13/2020   Lab Results  Component Value Date   CHOL 185 08/13/2020   HDL 29 (L) 08/13/2020   LDLCALC 127 (H) 08/13/2020   TRIG 161 (H) 08/13/2020   CHOLHDL 6.4 (H) 08/13/2020   No results found for: TSH No results found for: HGBA1C Lab Results  Component Value Date   WBC 4.9 08/13/2020   HGB 17.5 08/13/2020   HCT 50.5 08/13/2020   MCV 91 08/13/2020   PLT 260 08/13/2020   Lab Results  Component Value Date   ALT 21 08/13/2020   AST 17 08/13/2020   ALKPHOS 84 08/13/2020   BILITOT 0.3 08/13/2020     Review of Systems  Respiratory: Negative for chest tightness and shortness of breath.   Cardiovascular: Negative for chest pain and palpitations.  Musculoskeletal: Positive for arthralgias and myalgias. Negative for gait problem, joint swelling, neck pain and neck stiffness.  Neurological: Positive for tingling (during the night while sleeping on his back). Negative for dizziness, weakness and numbness.    Patient Active Problem List   Diagnosis Date Noted  . Mixed hyperlipidemia 08/14/2020  . BMI 34.0-34.9,adult  02/13/2020    No Known Allergies  History reviewed. No pertinent surgical history.  Social History   Tobacco Use  . Smoking status: Never Smoker  . Smokeless tobacco: Never Used  Vaping Use  . Vaping Use: Never used  Substance Use Topics  . Alcohol use: Yes    Comment: social  . Drug use: No     Medication list has been reviewed and updated.  Current Meds  Medication Sig  . clotrimazole-betamethasone (LOTRISONE) cream Apply 1 application topically 2 (two) times daily.  Marland Kitchen ibuprofen (ADVIL,MOTRIN) 800 MG tablet Take 1 tablet (800 mg total) by mouth 3 (three) times daily.    PHQ 2/9 Scores 11/27/2020 02/13/2020  PHQ - 2 Score 0 0  PHQ- 9 Score 0 0    GAD 7 : Generalized Anxiety Score 11/27/2020  Nervous, Anxious, on Edge 0  Control/stop worrying 0  Worry too much - different things 0  Trouble relaxing 0  Restless 0  Easily annoyed or irritable 0  Afraid - awful might happen 0  Total GAD 7 Score 0    BP Readings from Last 3 Encounters:  11/27/20 106/86  08/13/20 116/78  02/13/20 118/70    Physical Exam Vitals and nursing note reviewed.  Constitutional:      General: He is not in acute distress.    Appearance: He is well-developed.  HENT:     Head: Normocephalic  and atraumatic.  Cardiovascular:     Rate and Rhythm: Normal rate and regular rhythm.  Pulmonary:     Effort: Pulmonary effort is normal. No respiratory distress.     Breath sounds: No wheezing or rhonchi.  Musculoskeletal:     Right shoulder: Normal.     Left shoulder: No swelling or bony tenderness. Decreased range of motion. Normal strength. Normal pulse.     Cervical back: Normal range of motion and neck supple. No rigidity or tenderness.     Comments: Discomfort with abduction past 45 degrees Discomfort with external rotation of shoulder against resistance  Skin:    General: Skin is warm and dry.     Findings: No rash.  Neurological:     Mental Status: He is alert and oriented to person,  place, and time.  Psychiatric:        Mood and Affect: Mood and affect normal.        Behavior: Behavior normal.        Thought Content: Thought content normal.     Wt Readings from Last 3 Encounters:  11/27/20 275 lb (124.7 kg)  08/13/20 267 lb (121.1 kg)  02/13/20 278 lb (126.1 kg)    BP 106/86   Pulse 72   Temp 98 F (36.7 C) (Oral)   Ht 6\' 3"  (1.905 m)   Wt 275 lb (124.7 kg)   SpO2 97%   BMI 34.37 kg/m   Assessment and Plan: 1. Strain of left shoulder, initial encounter Due to heavy lifting without specific incident Continue Ice for today then heat Ibuprofen 600 mg tid Out of work for the next 2 days   Partially dictated using . Any errors are unintentional.  Animal nutritionist, MD [facility]  11/27/2020

## 2020-11-27 NOTE — Patient Instructions (Signed)
Ibuprofen 600 mg three times a day  Ice for the first day or two then heat

## 2021-01-03 DIAGNOSIS — L408 Other psoriasis: Secondary | ICD-10-CM | POA: Diagnosis not present

## 2021-01-03 DIAGNOSIS — L738 Other specified follicular disorders: Secondary | ICD-10-CM | POA: Diagnosis not present

## 2021-01-03 DIAGNOSIS — L304 Erythema intertrigo: Secondary | ICD-10-CM | POA: Diagnosis not present

## 2021-01-10 ENCOUNTER — Other Ambulatory Visit: Payer: Self-pay

## 2021-01-10 ENCOUNTER — Telehealth: Payer: Self-pay | Admitting: Physician Assistant

## 2021-01-10 ENCOUNTER — Ambulatory Visit: Payer: Self-pay | Admitting: *Deleted

## 2021-01-10 ENCOUNTER — Ambulatory Visit
Admission: EM | Admit: 2021-01-10 | Discharge: 2021-01-10 | Disposition: A | Discharge status: Home / Self Care | Payer: 59 | Attending: Physician Assistant | Admitting: Physician Assistant

## 2021-01-10 ENCOUNTER — Encounter: Payer: Self-pay | Admitting: Emergency Medicine

## 2021-01-10 ENCOUNTER — Ambulatory Visit (INDEPENDENT_AMBULATORY_CARE_PROVIDER_SITE_OTHER)
Admit: 2021-01-10 | Discharge: 2021-01-10 | Disposition: A | Discharge status: Home / Self Care | Payer: 59 | Attending: Sports Medicine | Admitting: Sports Medicine

## 2021-01-10 DIAGNOSIS — G8929 Other chronic pain: Secondary | ICD-10-CM | POA: Diagnosis not present

## 2021-01-10 DIAGNOSIS — Z87828 Personal history of other (healed) physical injury and trauma: Secondary | ICD-10-CM | POA: Diagnosis not present

## 2021-01-10 DIAGNOSIS — R519 Headache, unspecified: Secondary | ICD-10-CM

## 2021-01-10 LAB — GLUCOSE, CAPILLARY: Glucose-Capillary: 95 mg/dL (ref 70–99)

## 2021-01-10 MED ORDER — DICLOFENAC SODIUM 75 MG PO TBEC: 75.0000 mg | DELAYED_RELEASE_TABLET | Freq: Two times a day (BID) | ORAL | 0 refills | Status: AC

## 2021-01-10 NOTE — ED Triage Notes (Signed)
Pt c/o headaches. Started about a month ago. He states he hit his head while snowboarding back in January and cracked a helmet. Since then he has been getting headache after and during work outs and sneezing. He states this happened yesterday and he still has the headache today.

## 2021-01-10 NOTE — ED Notes (Signed)
No auth required for in network. Reference number [id number]

## 2021-01-10 NOTE — Telephone Encounter (Addendum)
Pt called in co a severe headache that happened while he was working out in [employer]   "It was so bad it almost brought me to my knees".   He has had it all night and this morning.  He has taken Tylenol with some relief.    On occasion he gets exertional headaches with sneezing and working out but nothing like last night.   This headache is worse and not going away. Denies dizziness, visual changes, confusion, fainting.   Does c/o a stiff neck after last night.  He did have a snow boarding accident in Jan. Where he fell and hit his head hard enough to crack his helmet.   He wasn't seen after that incident.   Denied any problems after the accident.   He was able to get up and go on down the hill without a problem.   "It rang my bell right after it happened but no loss of consciousness or headaches since".     There are no appt available with Dr. Judithann Graves at [facility].   Protocol is to be seen within 4 hours.  I have sent a high priority note to the office for them to review for a possible work in.    I encouraged him not to do any work outs until evaluated.   He was agreeable.  He can be reached at [phone].  Covid questionnaire completed.   The headache indicated a virtual visit however this headache is not covid related so he is requesting to be seen in the office for this issue.  I called into the office and let them know about his issue.   They are going to call him back.    Reason for Disposition . [1] SEVERE headache (e.g., excruciating) AND [2] not improved after 2 hours of pain medicine  Answer Assessment - Initial Assessment Questions 1. LOCATION: "Where does it hurt?"      I've been going to the gym for last couple of weeks.  I'm training hard in the gym.   I get occasional headaches.  2. ONSET: "When did the headache start?" (Minutes, hours or days)      Last night I got a sudden bad headache while in the gym.  It's been there since even this morning. I will  sneeze and I get a bad headache that changes every so often. I'm concerned about this bad a headache.   Back in Jan. I was snow boarding and fell and hit my head really hard.   I cracked my helmet.   No CT scans or seen after the fall. I don't get headaches.   3. PATTERN: "Does the pain come and go, or has it been constant since it started?"     Last night it radiated down into my neck in the back and around my neck.    My neck is tight this morning. 4. SEVERITY: "How bad is the pain?" and "What does it keep you from doing?"  (e.g., Scale 1-10; mild, moderate, or severe)   - MILD (1-3): doesn't interfere with normal activities    - MODERATE (4-7): interferes with normal activities or awakens from sleep    - SEVERE (8-10): excruciating pain, unable to do any normal activities        Severe last night.   Sudden headache that almost brought me to my knees.  5. RECURRENT SYMPTOM: "Have you ever had headaches before?" If Yes, ask: "When was the last time?"  and "What happened that time?"      No headaches before.   The headaches are stressed induced like with lifting weights or sneezing.   But last night that headache was bad and is still there.   No dizziness or visual changes Back in Jan. Just starting to work out so don't know if there is any relationship from the snow board accident.   When I fell it "rang my bell when it happened" the accident.  I got up and went down the hill without a problem. 6. CAUSE: "What do you think is causing the headache?"     I don't know   Triggered by exertion  7. MIGRAINE: "Have you been diagnosed with migraine headaches?" If Yes, ask: "Is this headache similar?"      No  8. HEAD INJURY: "Has there been any recent injury to the head?"      Yes in Jan.  See above 9. OTHER SYMPTOMS: "Do you have any other symptoms?" (fever, stiff neck, eye pain, sore throat, cold symptoms)     Stiff neck this morning.   10. PREGNANCY: "Is there any chance you are pregnant?" "When  was your last menstrual period?"       N/A  Protocols used: HEADACHE-A-AH

## 2021-01-10 NOTE — ED Provider Notes (Signed)
[facility]    CSN: [id number] Arrival date & time: 01/10/21  1059      History   Chief Complaint Chief Complaint  Patient presents with  . Headache    HPI Travis Vargas. is a 25 y.o. male presenting for headaches x2 months. Patient states that at the beginning of January he got into a snowboarding accident and fell off of his snowboard and hit his head on the ground. He says he was wearing a helmet but the helmet cracked. He denies any loss of consciousness but states he has had headaches nearly every day since. Patient says that the headaches seem to come on whenever he is working out heavily exerting himself. Patient says that he does a lot of weightlifting. He says that he works out 6 days a week and gets a headache nearly every time he works out. He also says sometimes sneezing causes him to have a headache. He admits to some pain in the upper part of his neck as well. No real pain with movement of the neck. Patient denies any associated dizziness, weakness, vision changes, nausea/vomiting, balance or speech difficulty, facial drooping, numbness/tingling, sleep disturbances, fatigue or irritability. Patient does admit that he has had "exertional headaches" in the past before he had his head injury but they seem to be worse since. He is taken Tylenol when he gets these headaches but says it has not really helped. He currently has a headache of the top of his head and upper neck. Patient not taking any anticoagulants. He denies any significant medical history. Not taking any routine medication. He has no other complaints or concerns.  HPI  History reviewed. No pertinent past medical history.  Patient Active Problem List   Diagnosis Date Noted  . Mixed hyperlipidemia 08/14/2020  . BMI 34.0-34.9,adult 02/13/2020    History reviewed. No pertinent surgical history.     Home Medications    Prior to Admission medications   Medication Sig Start Date End Date Taking?  Authorizing Provider  clotrimazole-betamethasone (LOTRISONE) cream Apply 1 application topically 2 (two) times daily. 02/13/20   Reubin Milan, MD  Halobetasol Propionate (LEXETTE) 0.05 % FOAM Apply topically. 11/21/20   [provider]  ibuprofen (ADVIL,MOTRIN) 800 MG tablet Take 1 tablet (800 mg total) by mouth 3 (three) times daily. 12/21/16   Domenick Gong, MD  triamcinolone (KENALOG) 0.025 % cream Apply topically. 11/21/20   [provider]    Family History Family History  Problem Relation Age of Onset  . Breast cancer Mother   . Diabetes Father     Social History Social History   Tobacco Use  . Smoking status: Never Smoker  . Smokeless tobacco: Never Used  Vaping Use  . Vaping Use: Never used  Substance Use Topics  . Alcohol use: Yes    Comment: social  . Drug use: No     Allergies   Patient has no known allergies.   Review of Systems Review of Systems  Constitutional: Negative for fatigue.  HENT: Negative for congestion.   Eyes: Negative for photophobia, pain and visual disturbance.  Respiratory: Negative for shortness of breath.   Gastrointestinal: Negative for nausea and vomiting.  Musculoskeletal: Negative for arthralgias and neck pain.  Skin: Negative for wound.  Neurological: Positive for headaches. Negative for dizziness, tremors, seizures, syncope, facial asymmetry, speech difficulty, weakness, light-headedness and numbness.  Psychiatric/Behavioral: Negative for confusion, dysphoric mood and sleep disturbance. The patient is not nervous/anxious.  Physical Exam Triage Vital Signs ED Triage Vitals  Enc Vitals Group     BP 01/10/21 1113 119/82     Pulse Rate 01/10/21 1113 71     Resp 01/10/21 1113 18     Temp 01/10/21 1113 98.1 F (36.7 C)     Temp Source 01/10/21 1113 Oral     SpO2 01/10/21 1113 98 %     Weight 01/10/21 1111 260 lb (117.9 kg)     Height 01/10/21 1111 6\' 3"  (1.905 m)     Head Circumference --      Peak  Flow --      Pain Score 01/10/21 1111 3     Pain Loc --      Pain Edu? --      Excl. in GC? --    No data found.  Updated Vital Signs BP 119/82 (BP Location: Left Arm)   Pulse 71   Temp 98.1 F (36.7 C) (Oral)   Resp 18   Ht 6\' 3"  (1.905 m)   Wt 260 lb (117.9 kg)   SpO2 98%   BMI 32.50 kg/m       Physical Exam Vitals and nursing note reviewed.  Constitutional:      General: He is not in acute distress.    Appearance: Normal appearance. He is well-developed, normal weight and well-nourished. He is not ill-appearing or diaphoretic.  HENT:     Head: Normocephalic and atraumatic.     Right Ear: Tympanic membrane, ear canal and external ear normal.     Left Ear: Tympanic membrane, ear canal and external ear normal.     Nose: Nose normal.     Mouth/Throat:     Mouth: Mucous membranes are moist.     Pharynx: Oropharynx is clear.  Eyes:     General: No scleral icterus.       Right eye: No discharge.        Left eye: No discharge.     Extraocular Movements: Extraocular movements intact.     Conjunctiva/sclera: Conjunctivae normal.     Pupils: Pupils are equal, round, and reactive to light.  Cardiovascular:     Rate and Rhythm: Normal rate and regular rhythm.     Heart sounds: Normal heart sounds.  Pulmonary:     Effort: Pulmonary effort is normal. No respiratory distress.     Breath sounds: Normal breath sounds.  Musculoskeletal:        General: No edema.     Cervical back: Neck supple.  Skin:    General: Skin is warm and dry.  Neurological:     General: No focal deficit present.     Mental Status: He is alert and oriented to person, place, and time. Mental status is at baseline.     Cranial Nerves: No cranial nerve deficit.     Sensory: Sensation is intact. No sensory deficit.     Motor: No weakness.     Coordination: Coordination normal. Finger-Nose-Finger Test and Heel to Shin Test normal.     Gait: Gait normal.     Comments: Slight difficulty with shallow knee  bend  Psychiatric:        Mood and Affect: Mood and affect and mood normal.        Behavior: Behavior normal.        Thought Content: Thought content normal.      UC Treatments / Results  Labs (all labs ordered are listed, but only abnormal results are displayed) Labs  Reviewed  GLUCOSE, CAPILLARY  CBG MONITORING, ED    EKG   Radiology No results found.  Procedures Procedures (including critical care time)  Medications Ordered in UC Medications - No data to display  Initial Impression / Assessment and Plan / UC Course  I have reviewed the triage vital signs and the nursing notes.  Pertinent labs & imaging results that were available during my care of the patient were reviewed by me and considered in my medical decision making (see chart for details).   25 year old male presenting for 59-month history of headaches.  Headaches associated with exertion and exercising.  He does report a head injury prior to onset of the worsening of his headaches.  Vital signs are all stable.  Exam is reassuring.  He only has slight difficulty with a shallow knee bend.  His neurological exam is normal.  Blood glucose is 95.   CT scan without contrast of head obtained today due to duration of headaches and reported head injury.  [facility] system is currently down and will not get the overread back from radiologist for a little while and patient is stable so I have sent him home advised I will call with results.  ED precautions given to patient.  CT head without contrast is within normal limits.  Call to discuss results with patient.  Advised diclofenac sodium and Tylenol as needed for headaches and to avoid any exacerbating factors.  Advised to make a follow-up with PCP and inquire about a referral to neurology for further work-up and treatment.  ED precautions again reviewed patient.  Final Clinical Impressions(s) / UC Diagnoses   Final diagnoses:  Chronic nonintractable headache, unspecified  headache type  History of head injury     Discharge Instructions     I will call you with the results of the CT scan since our system is not transferring the images to the radiologist to read at this time.  In the meantime if you have any sudden worsening of your headache to the emergency department.  You will likely need a referral to neurology for these ongoing headaches.  Contact your PCP to ask for a referral to be placed to a neurologist.  There are certain medications that you can be placed on to try to prevent headaches.  Hold off on any exertional exercises until I get your CT scan result.  I would also advise that if this is leading you to more headaches to be letter exercise until you see a neurologist.  Continue Tylenol for headache as needed.    ED Prescriptions    None     PDMP not reviewed this encounter.   Shirlee Latch, PA-C 01/10/21 1441

## 2021-01-10 NOTE — Discharge Instructions (Signed)
I will call you with the results of the CT scan since our system is not transferring the images to the radiologist to read at this time.  In the meantime if you have any sudden worsening of your headache to the emergency department.  You will likely need a referral to neurology for these ongoing headaches.  Contact your PCP to ask for a referral to be placed to a neurologist.  There are certain medications that you can be placed on to try to prevent headaches.  Hold off on any exertional exercises until I get your CT scan result.  I would also advise that if this is leading you to more headaches to be letter exercise until you see a neurologist.  Continue Tylenol for headache as needed.

## 2021-01-10 NOTE — Telephone Encounter (Signed)
Discussed results with CT of the head scan with patient.  Advised abnormal results.  Sent diclofenac sodium to pharmacy for headache.  Advise follow-up with PCP.  ED precautions reviewed.

## 2021-01-11 ENCOUNTER — Encounter: Payer: Self-pay | Admitting: Internal Medicine

## 2021-01-11 ENCOUNTER — Ambulatory Visit: Payer: 59 | Admitting: Internal Medicine

## 2021-01-11 VITALS — BP 108/82 | HR 90 | Temp 98.0°F | Ht 75.0 in | Wt 272.0 lb

## 2021-01-11 DIAGNOSIS — G44209 Tension-type headache, unspecified, not intractable: Secondary | ICD-10-CM | POA: Diagnosis not present

## 2021-01-11 NOTE — Progress Notes (Signed)
Date:  01/11/2021   Name:  Travis Vargas.   DOB:  [birth year]   MRN:  [id number]   Chief Complaint: Headache (X2 months, comes and goes, mainly comes when moving around a lot, mainly back of head)  Headache  This is a new problem. The current episode started more than 1 month ago. The problem occurs intermittently. Pain location: back of the head. The pain does not radiate. The pain quality is similar to prior headaches. The quality of the pain is described as pulsating and shooting. The pain is moderate. Associated symptoms include scalp tenderness. Pertinent negatives include no blurred vision, dizziness, eye watering, facial sweating, nausea, phonophobia, photophobia, swollen glands, vomiting or weakness. He has tried acetaminophen for the symptoms. The treatment provided mild relief.  Seemed to start in [place] after a snow boarding accident where he hit his head and cracked his helmet.  He was seen in ER yesterday - head CT was negative.  No therapy was recommended. Working out [employer] is the primary trigger - occasional forceful sneeze also can trigger.  The pain is in the posterior scalp - no other associated sx.  Took tylenol after the severe pain this week - minor benefit noted.  Felt better after a nights sleep.  Lab Results  Component Value Date   CREATININE 0.99 08/13/2020   BUN 8 08/13/2020   NA 140 08/13/2020   K 4.4 08/13/2020   CL 102 08/13/2020   CO2 25 08/13/2020   Lab Results  Component Value Date   CHOL 185 08/13/2020   HDL 29 (L) 08/13/2020   LDLCALC 127 (H) 08/13/2020   TRIG 161 (H) 08/13/2020   CHOLHDL 6.4 (H) 08/13/2020   No results found for: TSH No results found for: HGBA1C Lab Results  Component Value Date   WBC 4.9 08/13/2020   HGB 17.5 08/13/2020   HCT 50.5 08/13/2020   MCV 91 08/13/2020   PLT 260 08/13/2020   Lab Results  Component Value Date   ALT 21 08/13/2020   AST 17 08/13/2020   ALKPHOS 84 08/13/2020   BILITOT 0.3  08/13/2020     Review of Systems  Constitutional: Negative for chills and fatigue.  Eyes: Negative for blurred vision and photophobia.  Respiratory: Negative for shortness of breath.   Cardiovascular: Negative for palpitations.  Gastrointestinal: Negative for nausea and vomiting.  Skin: Negative for color change and rash.  Neurological: Positive for headaches. Negative for dizziness, weakness and light-headedness.  Psychiatric/Behavioral: Negative for dysphoric mood and sleep disturbance. The patient is not nervous/anxious.     Patient Active Problem List   Diagnosis Date Noted  . Mixed hyperlipidemia 08/14/2020  . BMI 34.0-34.9,adult 02/13/2020    No Known Allergies  History reviewed. No pertinent surgical history.  Social History   Tobacco Use  . Smoking status: Never Smoker  . Smokeless tobacco: Never Used  Vaping Use  . Vaping Use: Never used  Substance Use Topics  . Alcohol use: Yes    Comment: social  . Drug use: No     Medication list has been reviewed and updated.  Current Meds  Medication Sig  . clindamycin (CLEOCIN T) 1 % lotion Apply topically.  . clotrimazole (LOTRIMIN) 1 % cream Apply topically.  . clotrimazole-betamethasone (LOTRISONE) cream Apply 1 application topically 2 (two) times daily.  . diclofenac (VOLTAREN) 75 MG EC tablet Take 1 tablet (75 mg total) by mouth 2 (two) times daily for 15 days.  . Halobetasol Propionate (  LEXETTE) 0.05 % FOAM Apply topically.  Marland Kitchen ibuprofen (ADVIL,MOTRIN) 800 MG tablet Take 1 tablet (800 mg total) by mouth 3 (three) times daily.  Marland Kitchen triamcinolone (KENALOG) 0.025 % cream Apply topically.    PHQ 2/9 Scores 01/11/2021 11/27/2020 02/13/2020  PHQ - 2 Score 0 0 0  PHQ- 9 Score 0 0 0    GAD 7 : Generalized Anxiety Score 01/11/2021 11/27/2020  Nervous, Anxious, on Edge 0 0  Control/stop worrying 0 0  Worry too much - different things 0 0  Trouble relaxing 0 0  Restless 0 0  Easily annoyed or irritable 0 0  Afraid -  awful might happen 0 0  Total GAD 7 Score 0 0    BP Readings from Last 3 Encounters:  01/11/21 108/82  01/10/21 119/82  11/27/20 106/86    Physical Exam Vitals and nursing note reviewed.  Constitutional:      General: He is not in acute distress.    Appearance: He is well-developed.  HENT:     Head: Normocephalic and atraumatic.  Neck:     Thyroid: No thyroid mass.  Cardiovascular:     Rate and Rhythm: Normal rate and regular rhythm.     Heart sounds: Normal heart sounds.  Pulmonary:     Effort: Pulmonary effort is normal. No respiratory distress.     Breath sounds: No wheezing or rhonchi.  Musculoskeletal:     Cervical back: Normal and full passive range of motion without pain. No rigidity or crepitus. No pain with movement or spinous process tenderness. Normal range of motion.  Skin:    General: Skin is warm and dry.     Findings: No rash.  Neurological:     General: No focal deficit present.     Mental Status: He is alert and oriented to person, place, and time.     Cranial Nerves: Cranial nerves are intact.     Sensory: Sensation is intact.     Motor: Motor function is intact.  Psychiatric:        Mood and Affect: Mood normal.        Behavior: Behavior normal.     Wt Readings from Last 3 Encounters:  01/11/21 272 lb (123.4 kg)  01/10/21 260 lb (117.9 kg)  11/27/20 275 lb (124.7 kg)    BP 108/82   Pulse 90   Temp 98 F (36.7 C) (Oral)   Ht 6\' 3"  (1.905 m)   Wt 272 lb (123.4 kg)   SpO2 96%   BMI 34.00 kg/m   Assessment and Plan: 1. Muscle tension headache Triggered by weight lifting and occasionally sneezing Recommend heat to neck and shoulders followed by stretching PRIOR to work outs. Use tylenol and ice after workouts to abort a prolonged headache If persistent, return for further evaluation.   Partially dictated using . Any errors are unintentional.  Animal nutritionist, MD [facility]  01/11/2021

## 2021-01-16 ENCOUNTER — Ambulatory Visit: Payer: 59

## 2021-03-07 DIAGNOSIS — M67833 Other specified disorders of tendon, right wrist: Secondary | ICD-10-CM | POA: Diagnosis not present

## 2021-03-21 DIAGNOSIS — M25531 Pain in right wrist: Secondary | ICD-10-CM | POA: Diagnosis not present

## 2021-07-19 DIAGNOSIS — L578 Other skin changes due to chronic exposure to nonionizing radiation: Secondary | ICD-10-CM | POA: Diagnosis not present

## 2021-07-19 DIAGNOSIS — L408 Other psoriasis: Secondary | ICD-10-CM | POA: Diagnosis not present

## 2021-07-19 DIAGNOSIS — D225 Melanocytic nevi of trunk: Secondary | ICD-10-CM | POA: Diagnosis not present

## 2021-08-16 ENCOUNTER — Other Ambulatory Visit: Payer: Self-pay

## 2021-08-16 ENCOUNTER — Ambulatory Visit (INDEPENDENT_AMBULATORY_CARE_PROVIDER_SITE_OTHER): Payer: 59 | Admitting: Internal Medicine

## 2021-08-16 ENCOUNTER — Encounter: Payer: Self-pay | Admitting: Internal Medicine

## 2021-08-16 VITALS — BP 104/62 | HR 78 | Ht 75.0 in | Wt 253.2 lb

## 2021-08-16 DIAGNOSIS — Z23 Encounter for immunization: Secondary | ICD-10-CM | POA: Diagnosis not present

## 2021-08-16 DIAGNOSIS — K625 Hemorrhage of anus and rectum: Secondary | ICD-10-CM

## 2021-08-16 DIAGNOSIS — L409 Psoriasis, unspecified: Secondary | ICD-10-CM | POA: Insufficient documentation

## 2021-08-16 DIAGNOSIS — S46912A Strain of unspecified muscle, fascia and tendon at shoulder and upper arm level, left arm, initial encounter: Secondary | ICD-10-CM | POA: Diagnosis not present

## 2021-08-16 DIAGNOSIS — Z0000 Encounter for general adult medical examination without abnormal findings: Secondary | ICD-10-CM | POA: Diagnosis not present

## 2021-08-16 DIAGNOSIS — E782 Mixed hyperlipidemia: Secondary | ICD-10-CM | POA: Diagnosis not present

## 2021-08-16 MED ORDER — HYDROCORT-PRAMOXINE (PERIANAL) 1-1 % EX FOAM: 1.0000 | Freq: Two times a day (BID) | CUTANEOUS | 2 refills | Status: AC

## 2021-08-16 NOTE — Progress Notes (Signed)
Date:  08/16/2021   Name:  Travis Vargas.   DOB:  [birth year]   MRN:  [id number]   Chief Complaint: Annual Exam Travis Francois. is a 25 y.o. male who presents today for his Complete Annual Exam. He feels well. He reports exercising 5 x weekly. He reports he is sleeping well.    Immunization History  Administered Date(s) Administered   Influenza, Quadrivalent, Recombinant, Inj, Pf 09/27/2019   Influenza,inj,Quad PF,6+ Mos 08/13/2020   PFIZER(Purple Top)SARS-COV-2 Vaccination 02/03/2020, 02/24/2020    Rectal Bleeding  The current episode started more than 1 week ago. The onset is undetermined. The problem occurs occasionally (occasionally after a bowel movement). The problem has been unchanged. The pain is mild. The stool is described as soft. There was no prior successful therapy. Pertinent negatives include no abdominal pain, no diarrhea, no hemorrhoids, no chest pain, no headaches and no rash.  Shoulder Pain  The pain is present in the left shoulder. This is a new problem. The current episode started more than 1 month ago. The problem occurs intermittently. The problem has been unchanged. The quality of the pain is described as aching. The pain is mild. Associated symptoms comments: Occurs while working out with UE weights/resistance.  Does not bother him much otherwise..   Lab Results  Component Value Date   CREATININE 0.99 08/13/2020   BUN 8 08/13/2020   NA 140 08/13/2020   K 4.4 08/13/2020   CL 102 08/13/2020   CO2 25 08/13/2020   Lab Results  Component Value Date   CHOL 185 08/13/2020   HDL 29 (L) 08/13/2020   LDLCALC 127 (H) 08/13/2020   TRIG 161 (H) 08/13/2020   CHOLHDL 6.4 (H) 08/13/2020   No results found for: TSH No results found for: HGBA1C Lab Results  Component Value Date   WBC 4.9 08/13/2020   HGB 17.5 08/13/2020   HCT 50.5 08/13/2020   MCV 91 08/13/2020   PLT 260 08/13/2020   Lab Results  Component Value Date   ALT 21 08/13/2020   AST 17  08/13/2020   ALKPHOS 84 08/13/2020   BILITOT 0.3 08/13/2020     Review of Systems  Constitutional:  Negative for appetite change, chills, diaphoresis, fatigue and unexpected weight change.  HENT:  Negative for hearing loss, tinnitus, trouble swallowing and voice change.   Eyes:  Negative for visual disturbance.  Respiratory:  Negative for choking, shortness of breath and wheezing.   Cardiovascular:  Negative for chest pain, palpitations and leg swelling.  Gastrointestinal:  Positive for anal bleeding and hematochezia. Negative for abdominal pain, blood in stool, constipation, diarrhea and hemorrhoids.  Genitourinary:  Negative for difficulty urinating, dysuria and frequency.  Musculoskeletal:  Positive for arthralgias (left shoulder). Negative for back pain and myalgias.  Skin:  Negative for color change and rash.  Neurological:  Negative for dizziness, syncope and headaches.  Hematological:  Negative for adenopathy.  Psychiatric/Behavioral:  Negative for dysphoric mood and sleep disturbance. The patient is not nervous/anxious.    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 No pertinent surgical history.  Social History   Tobacco Use   Smoking status: Never   Smokeless tobacco: Never  Vaping Use   Vaping Use: Never used  Substance Use Topics   Alcohol use: Yes  Comment: social   Drug use: No     Medication list has been reviewed and updated.  Current Meds  Medication Sig   clindamycin (CLEOCIN T) 1 % lotion Apply topically.   clotrimazole (LOTRIMIN) 1 % cream Apply topically.   clotrimazole-betamethasone (LOTRISONE) cream Apply 1 application topically 2 (two) times daily.   Halobetasol Propionate (LEXETTE) 0.05 % FOAM Apply topically.   hydrocortisone-pramoxine (PROCTOFOAM-HC) rectal foam  Place 1 applicator rectally 2 (two) times daily.   ibuprofen (ADVIL,MOTRIN) 800 MG tablet Take 1 tablet (800 mg total) by mouth 3 (three) times daily.   triamcinolone (KENALOG) 0.025 % cream Apply topically.    PHQ 2/9 Scores 08/16/2021 01/11/2021 11/27/2020 02/13/2020  PHQ - 2 Score 0 0 0 0  PHQ- 9 Score 0 0 0 0    GAD 7 : Generalized Anxiety Score 08/16/2021 01/11/2021 11/27/2020  Nervous, Anxious, on Edge 0 0 0  Control/stop worrying 0 0 0  Worry too much - different things 0 0 0  Trouble relaxing 0 0 0  Restless 0 0 0  Easily annoyed or irritable 0 0 0  Afraid - awful might happen 0 0 0  Total GAD 7 Score 0 0 0  Anxiety Difficulty Not difficult at all - -    BP Readings from Last 3 Encounters:  08/16/21 104/62  01/11/21 108/82  01/10/21 119/82    Physical Exam Vitals and nursing note reviewed.  Constitutional:      Appearance: Normal appearance. He is well-developed.  HENT:     Head: Normocephalic.     Right Ear: Tympanic membrane, ear canal and external ear normal.     Left Ear: Tympanic membrane, ear canal and external ear normal.     Nose: Nose normal.  Eyes:     Conjunctiva/sclera: Conjunctivae normal.     Pupils: Pupils are equal, round, and reactive to light.  Neck:     Thyroid: No thyromegaly.     Vascular: No carotid bruit.  Cardiovascular:     Rate and Rhythm: Normal rate and regular rhythm.     Heart sounds: Normal heart sounds.  Pulmonary:     Effort: Pulmonary effort is normal.     Breath sounds: Normal breath sounds. No wheezing.  Chest:  Breasts:    Right: No mass.     Left: No mass.  Abdominal:     General: Bowel sounds are normal.     Palpations: Abdomen is soft.     Tenderness: There is no abdominal tenderness.  Genitourinary:    Rectum: No mass or external hemorrhoid.  Musculoskeletal:        General: Normal range of motion.     Right shoulder: Normal.     Left shoulder: Normal. No swelling, tenderness, bony tenderness or crepitus. Normal  range of motion.     Cervical back: Normal range of motion and neck supple.     Right lower leg: No edema.     Left lower leg: No edema.  Lymphadenopathy:     Cervical: No cervical adenopathy.  Skin:    General: Skin is warm and dry.  Neurological:     Mental Status: He is alert and oriented to person, place, and time.     Deep Tendon Reflexes: Reflexes are normal and symmetric.  Psychiatric:        Attention and Perception: Attention normal.        Mood and Affect: Mood normal.        Thought Content: Thought  content normal.    Wt Readings from Last 3 Encounters:  08/16/21 253 lb 3.2 oz (114.9 kg)  01/11/21 272 lb (123.4 kg)  01/10/21 260 lb (117.9 kg)    BP 104/62   Pulse 78   Ht 6\' 3"  (1.905 m)   Wt 253 lb 3.2 oz (114.9 kg)   SpO2 98%   BMI 31.65 kg/m   Assessment and Plan: 1. Annual physical exam Normal exam Continue exercise, diet changes for ongoing weight loss - CBC with Differential/Platelet - Comprehensive metabolic panel  2. Mixed hyperlipidemia Check labs - Lipid panel  3. Rectal bleeding Suspect mild fissure - will treat with proctofoam bid x 3 days episodically If persistent, will need GI evaluation - CBC with Differential/Platelet - hydrocortisone-pramoxine (PROCTOFOAM-HC) rectal foam; Place 1 applicator rectally 2 (two) times daily.  Dispense: 10 g; Refill: 2  4. Psoriasis Followed by Dermatology and treated with topical medication  5. Shoulder strain, left, initial encounter Recommend limited activity for 2 weeks then re-assess.   Partially dictated using . Any errors are unintentional.  Animal nutritionist, MD [facility]  08/16/2021

## 2021-08-17 LAB — COMPREHENSIVE METABOLIC PANEL
ALT: 26 IU/L (ref 0–44)
AST: 17 IU/L (ref 0–40)
Albumin/Globulin Ratio: 2 (ref 1.2–2.2)
Albumin: 4.5 g/dL (ref 4.1–5.2)
Alkaline Phosphatase: 78 IU/L (ref 44–121)
BUN/Creatinine Ratio: 10 (ref 9–20)
BUN: 12 mg/dL (ref 6–20)
Bilirubin Total: 0.4 mg/dL (ref 0.0–1.2)
CO2: 23 mmol/L (ref 20–29)
Calcium: 9.3 mg/dL (ref 8.7–10.2)
Chloride: 99 mmol/L (ref 96–106)
Creatinine, Ser: 1.23 mg/dL (ref 0.76–1.27)
Globulin, Total: 2.3 g/dL (ref 1.5–4.5)
Glucose: 56 mg/dL — ABNORMAL LOW (ref 70–99)
Potassium: 4.2 mmol/L (ref 3.5–5.2)
Sodium: 137 mmol/L (ref 134–144)
Total Protein: 6.8 g/dL (ref 6.0–8.5)
eGFR: 83 mL/min/{1.73_m2} (ref >59)

## 2021-08-17 LAB — CBC WITH DIFFERENTIAL/PLATELET
Basophils Absolute: 0 10*3/uL (ref 0.0–0.2)
Basos: 1 %
EOS (ABSOLUTE): 0.1 10*3/uL (ref 0.0–0.4)
Eos: 2 %
Hematocrit: 46 % (ref 37.5–51.0)
Hemoglobin: 15.8 g/dL (ref 13.0–17.7)
Immature Grans (Abs): 0 10*3/uL (ref 0.0–0.1)
Immature Granulocytes: 0 %
Lymphocytes Absolute: 1.1 10*3/uL (ref 0.7–3.1)
Lymphs: 24 %
MCH: 31 pg (ref 26.6–33.0)
MCHC: 34.3 g/dL (ref 31.5–35.7)
MCV: 90 fL (ref 79–97)
Monocytes Absolute: 0.8 10*3/uL (ref 0.1–0.9)
Monocytes: 17 %
Neutrophils Absolute: 2.6 10*3/uL (ref 1.4–7.0)
Neutrophils: 56 %
Platelets: 275 10*3/uL (ref 150–450)
RBC: 5.09 x10E6/uL (ref 4.14–5.80)
RDW: 12.3 % (ref 11.6–15.4)
WBC: 4.6 10*3/uL (ref 3.4–10.8)

## 2021-08-17 LAB — LIPID PANEL
Chol/HDL Ratio: 5.9 ratio — ABNORMAL HIGH (ref 0.0–5.0)
Cholesterol, Total: 154 mg/dL (ref 100–199)
HDL: 26 mg/dL — ABNORMAL LOW (ref >39)
LDL Chol Calc (NIH): 108 mg/dL — ABNORMAL HIGH (ref 0–99)
Triglycerides: 109 mg/dL (ref 0–149)
VLDL Cholesterol Cal: 20 mg/dL (ref 5–40)

## 2021-09-18 DIAGNOSIS — M25512 Pain in left shoulder: Secondary | ICD-10-CM | POA: Diagnosis not present

## 2021-11-06 DIAGNOSIS — M25512 Pain in left shoulder: Secondary | ICD-10-CM | POA: Diagnosis not present

## 2021-11-14 DIAGNOSIS — S43432A Superior glenoid labrum lesion of left shoulder, initial encounter: Secondary | ICD-10-CM | POA: Diagnosis not present

## 2021-11-14 DIAGNOSIS — M25512 Pain in left shoulder: Secondary | ICD-10-CM | POA: Diagnosis not present

## 2021-11-21 DIAGNOSIS — S43432A Superior glenoid labrum lesion of left shoulder, initial encounter: Secondary | ICD-10-CM | POA: Diagnosis not present

## 2021-11-21 DIAGNOSIS — S43439A Superior glenoid labrum lesion of unspecified shoulder, initial encounter: Secondary | ICD-10-CM | POA: Insufficient documentation

## 2021-11-21 DIAGNOSIS — M25512 Pain in left shoulder: Secondary | ICD-10-CM | POA: Diagnosis not present

## 2022-04-06 IMAGING — CT CT HEAD W/O CM
2 series · 15 of 30 positions shown, 17 images · non-contrast
Comparison: None.

CLINICAL DATA: Headache, fall while snowboarding [place]

EXAM:
CT HEAD WITHOUT CONTRAST
TECHNIQUE: Contiguous axial images were obtained from the base of the skull
through the vertex without intravenous contrast.

[Series 2: head wo · axial · 0.49mm/px · z∈[-118,+2]mm · 7 of 32 slices shown, 9 images]
[im 4/32  brain]
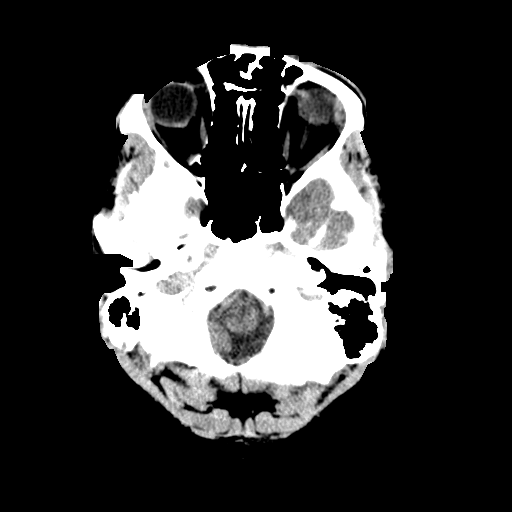
[im 4/32  bone]
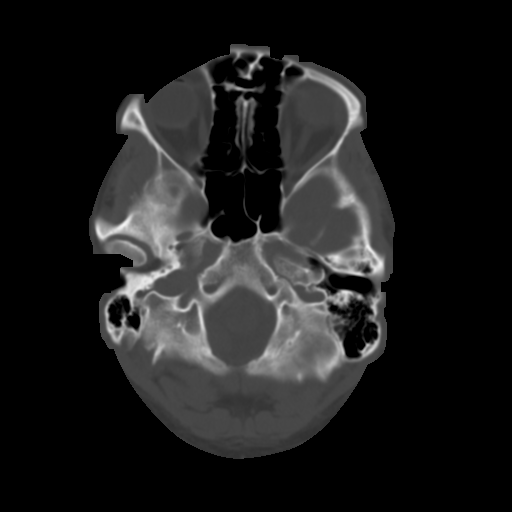
[im 8/32  brain]
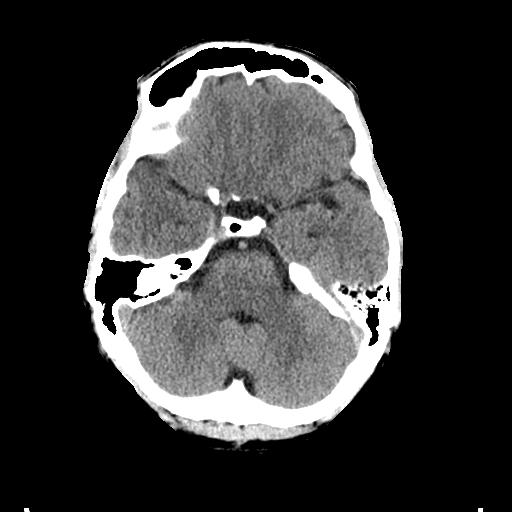
[im 12/32  brain]
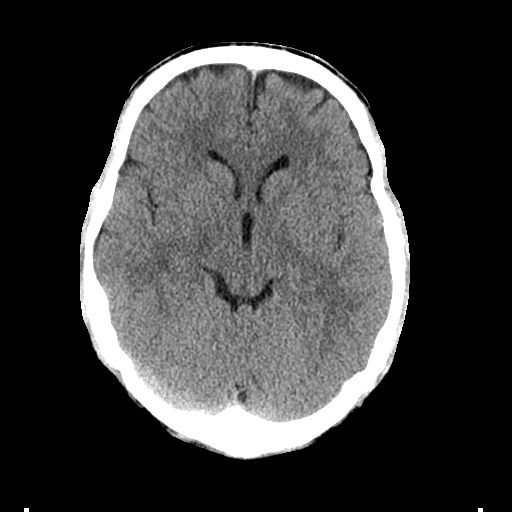
[im 16/32  brain]
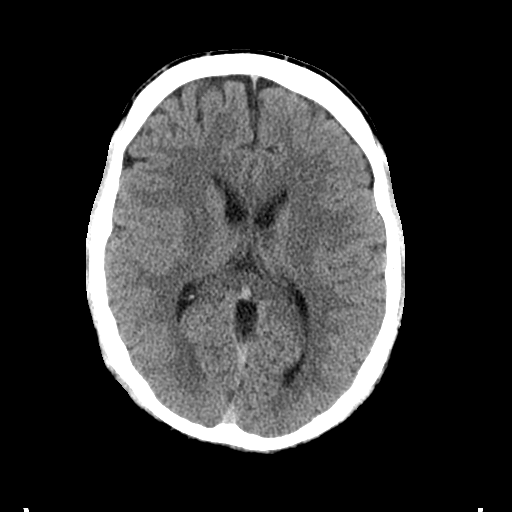
[im 20/32  brain]
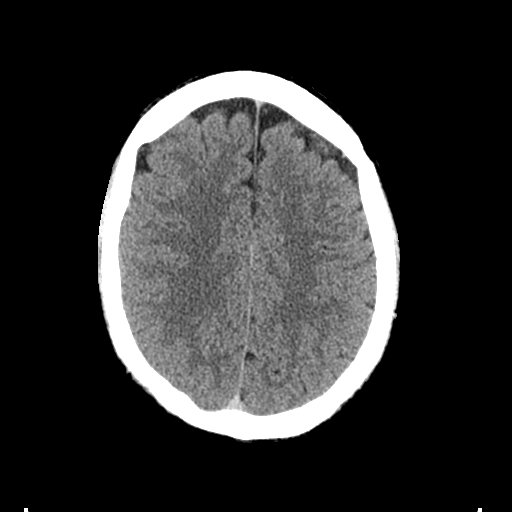
[im 20/32  bone]
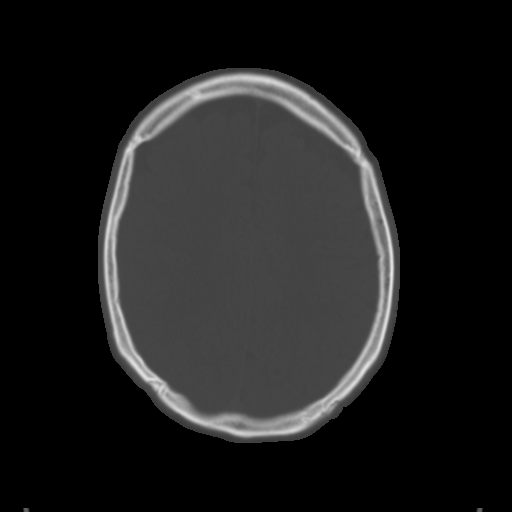
[im 24/32  brain]
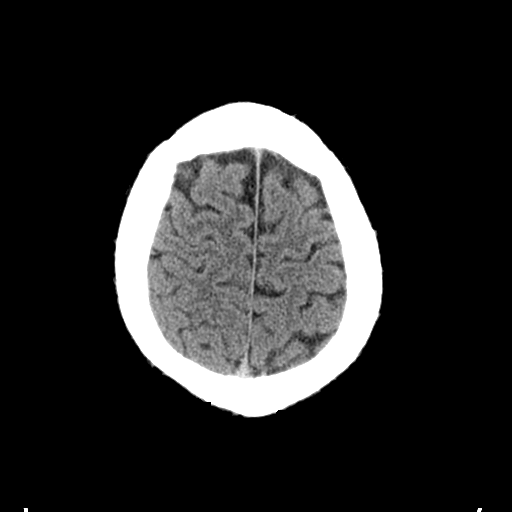
[im 28/32  brain]
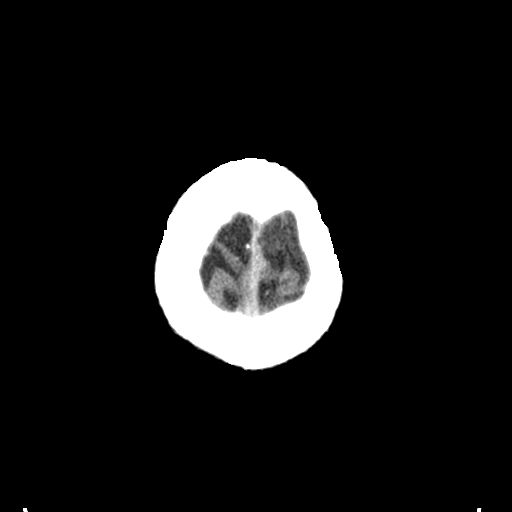

[Series 3: head bone · axial · 0.49mm/px · z∈[-119,+9]mm · 8 of 80 slices shown]
[im 8/80  bone]
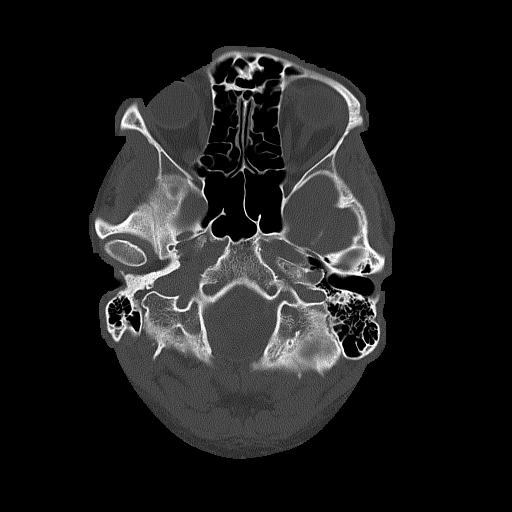
[im 16/80  bone]
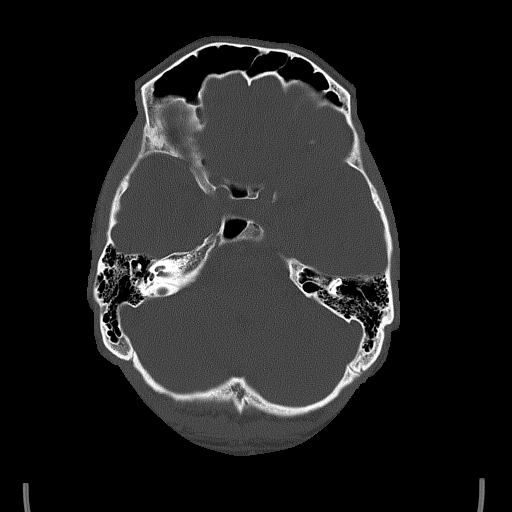
[im 24/80  bone]
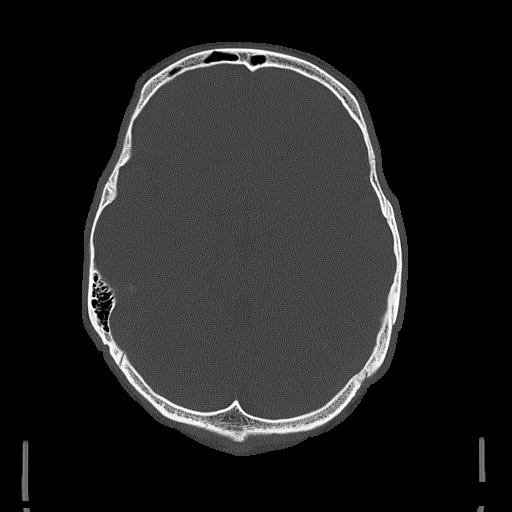
[im 36/80  bone]
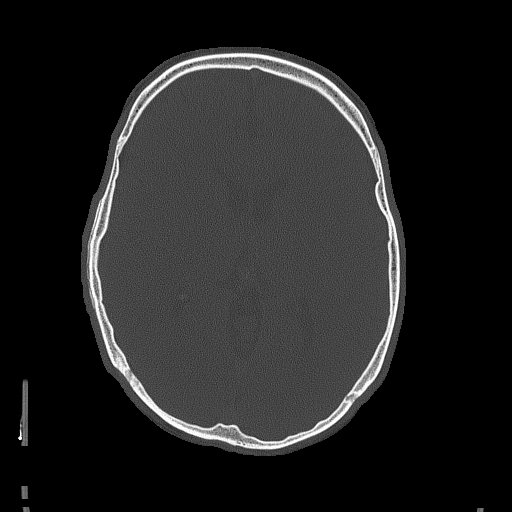
[im 44/80  bone]
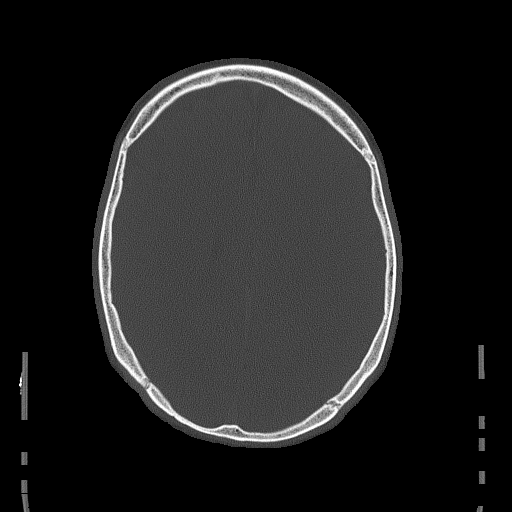
[im 56/80  bone]
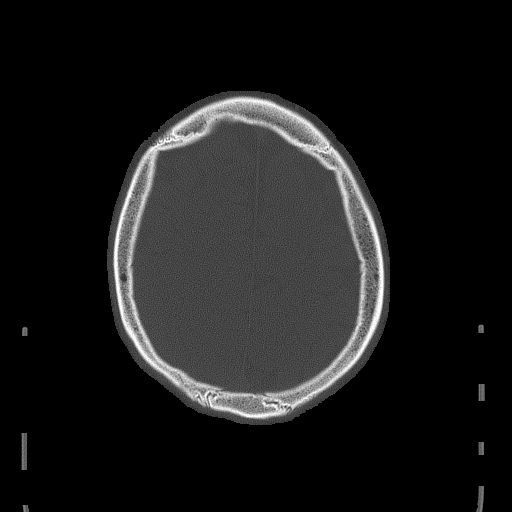
[im 64/80  bone]
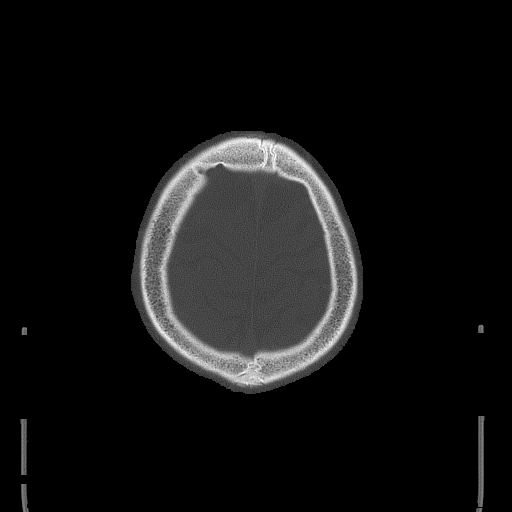
[im 72/80  bone]
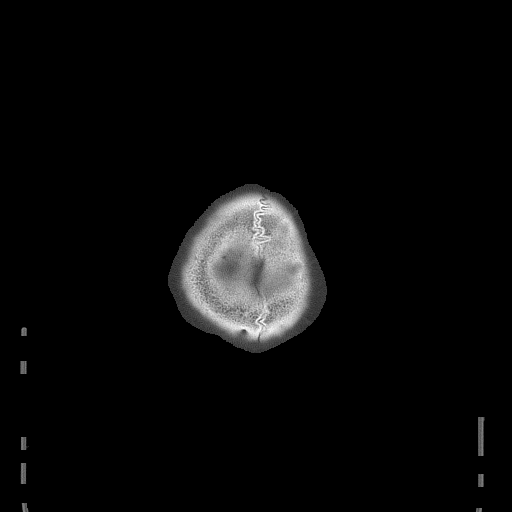

[15 of 30 positions shown; findings below may reference images not displayed]

FINDINGS: Brain: There is no acute intracranial hemorrhage, mass effect, or
edema. Gray-white differentiation is preserved. There is no
extra-axial fluid collection. Ventricles and sulci are within normal
limits in size and configuration.

Vascular: No hyperdense vessel or unexpected calcification.

Skull: Calvarium is unremarkable.

Sinuses/Orbits: No acute finding.

Other: None.
IMPRESSION: Normal head CT.

These results will be called to the ordering clinician or
representative by the [HOSPITAL] at the imaging location.
Review of images delayed by network error.

## 2022-05-23 DIAGNOSIS — S43432D Superior glenoid labrum lesion of left shoulder, subsequent encounter: Secondary | ICD-10-CM | POA: Diagnosis not present

## 2022-05-23 DIAGNOSIS — M25512 Pain in left shoulder: Secondary | ICD-10-CM | POA: Diagnosis not present

## 2022-06-05 DIAGNOSIS — M25512 Pain in left shoulder: Secondary | ICD-10-CM | POA: Diagnosis not present

## 2022-06-05 DIAGNOSIS — S43432D Superior glenoid labrum lesion of left shoulder, subsequent encounter: Secondary | ICD-10-CM | POA: Diagnosis not present

## 2022-06-12 DIAGNOSIS — M25512 Pain in left shoulder: Secondary | ICD-10-CM | POA: Diagnosis not present

## 2022-06-12 DIAGNOSIS — S43432D Superior glenoid labrum lesion of left shoulder, subsequent encounter: Secondary | ICD-10-CM | POA: Diagnosis not present

## 2022-06-19 DIAGNOSIS — S43432D Superior glenoid labrum lesion of left shoulder, subsequent encounter: Secondary | ICD-10-CM | POA: Diagnosis not present

## 2022-08-18 ENCOUNTER — Encounter: Payer: 59 | Admitting: Internal Medicine

## 2022-08-18 NOTE — Progress Notes (Deleted)
Date:  08/18/2022   Name:  Travis Vargas.   DOB:  [birth year]   MRN:  [id number]   Chief Complaint: No chief complaint on file. Travis Vargas. is a 26 y.o. male who presents today for his Complete Annual Exam. He feels {DESC; WELL/FAIRLY WELL/POORLY:18703}. He reports exercising ***. He reports he is sleeping {DESC; WELL/FAIRLY WELL/POORLY:18703}.    Immunization History  Administered Date(s) Administered   Influenza, Quadrivalent, Recombinant, Inj, Pf 09/27/2019   Influenza,inj,Quad PF,6+ Mos 08/13/2020, 08/16/2021   PFIZER(Purple Top)SARS-COV-2 Vaccination 02/03/2020, 02/24/2020   Health Maintenance Due  Topic Date Due   HIV Screening  Never done   TETANUS/TDAP  Never done   COVID-19 Vaccine (3 - Pfizer series) 04/20/2020   INFLUENZA VACCINE  06/17/2022    No results found for: "PSA1", "PSA"   HPI  Lab Results  Component Value Date   NA 137 08/16/2021   K 4.2 08/16/2021   CO2 23 08/16/2021   GLUCOSE 56 (L) 08/16/2021   BUN 12 08/16/2021   CREATININE 1.23 08/16/2021   CALCIUM 9.3 08/16/2021   EGFR 83 08/16/2021   GFRNONAA 105 08/13/2020   Lab Results  Component Value Date   CHOL 154 08/16/2021   HDL 26 (L) 08/16/2021   LDLCALC 108 (H) 08/16/2021   TRIG 109 08/16/2021   CHOLHDL 5.9 (H) 08/16/2021   No results found for: "TSH" No results found for: "HGBA1C" Lab Results  Component Value Date   WBC 4.6 08/16/2021   HGB 15.8 08/16/2021   HCT 46.0 08/16/2021   MCV 90 08/16/2021   PLT 275 08/16/2021   Lab Results  Component Value Date   ALT 26 08/16/2021   AST 17 08/16/2021   ALKPHOS 78 08/16/2021   BILITOT 0.4 08/16/2021   No results found for: "25OHVITD2", "25OHVITD3", "VD25OH"   Review of Systems  Constitutional:  Negative for appetite change, chills, diaphoresis, fatigue and unexpected weight change.  HENT:  Negative for hearing loss, tinnitus, trouble swallowing and voice change.   Eyes:  Negative for visual disturbance.   Respiratory:  Negative for choking, shortness of breath and wheezing.   Cardiovascular:  Negative for chest pain, palpitations and leg swelling.  Gastrointestinal:  Negative for abdominal pain, blood in stool, constipation and diarrhea.  Genitourinary:  Negative for difficulty urinating, dysuria and frequency.  Musculoskeletal:  Negative for arthralgias, back pain and myalgias.  Skin:  Negative for color change and rash.  Neurological:  Negative for dizziness, syncope and headaches.  Hematological:  Negative for adenopathy.  Psychiatric/Behavioral:  Negative for dysphoric mood and sleep disturbance. The patient is not nervous/anxious.     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  Social History   Tobacco Use   Smoking status: Never   Smokeless tobacco: Never  Vaping Use   Vaping Use: Never used  Substance Use Topics   Alcohol use: Yes    Comment: social   Drug use: No     Medication list has been reviewed and updated.  No outpatient medications have been marked as taking for the 08/18/22 encounter (Appointment) with Glean Hess, MD.       08/16/2021    8:10 AM 01/11/2021    8:48 AM 11/27/2020    2:35 PM  GAD 7 :  Generalized Anxiety Score  Nervous, Anxious, on Edge 0 0 0  Control/stop worrying 0 0 0  Worry too much - different things 0 0 0  Trouble relaxing 0 0 0  Restless 0 0 0  Easily annoyed or irritable 0 0 0  Afraid - awful might happen 0 0 0  Total GAD 7 Score 0 0 0  Anxiety Difficulty Not difficult at all         08/16/2021    8:10 AM 01/11/2021    8:48 AM 11/27/2020    2:35 PM  Depression screen PHQ 2/9  Decreased Interest 0 0 0  Down, Depressed, Hopeless 0 0 0  PHQ - 2 Score 0 0 0  Altered sleeping 0 0 0  Tired, decreased energy 0 0 0  Change in  appetite 0 0 0  Feeling bad or failure about yourself  0 0 0  Trouble concentrating 0 0 0  Moving slowly or fidgety/restless 0 0 0  Suicidal thoughts 0 0 0  PHQ-9 Score 0 0 0  Difficult doing work/chores Not difficult at all      BP Readings from Last 3 Encounters:  08/16/21 104/62  01/11/21 108/82  01/10/21 119/82    Physical Exam Vitals and nursing note reviewed.  Constitutional:      Appearance: Normal appearance. He is well-developed.  HENT:     Head: Normocephalic.     Right Ear: Tympanic membrane, ear canal and external ear normal.     Left Ear: Tympanic membrane, ear canal and external ear normal.     Nose: Nose normal.  Eyes:     Conjunctiva/sclera: Conjunctivae normal.     Pupils: Pupils are equal, round, and reactive to light.  Neck:     Thyroid: No thyromegaly.     Vascular: No carotid bruit.  Cardiovascular:     Rate and Rhythm: Normal rate and regular rhythm.     Pulses: Normal pulses.     Heart sounds: Normal heart sounds.  Pulmonary:     Effort: Pulmonary effort is normal.     Breath sounds: Normal breath sounds. No wheezing.  Chest:  Breasts:    Right: No mass.     Left: No mass.  Abdominal:     General: Bowel sounds are normal.     Palpations: Abdomen is soft.     Tenderness: There is no abdominal tenderness.  Musculoskeletal:        General: Normal range of motion.     Cervical back: Normal range of motion and neck supple.     Right lower leg: No edema.     Left lower leg: No edema.  Lymphadenopathy:     Cervical: No cervical adenopathy.  Skin:    General: Skin is warm and dry.     Capillary Refill: Capillary refill takes less than 2 seconds.  Neurological:     General: No focal deficit present.     Mental Status: He is alert and oriented to person, place, and time.     Deep Tendon Reflexes: Reflexes are normal and symmetric.  Psychiatric:        Attention and Perception: Attention normal.        Mood and Affect: Mood normal.         Thought Content: Thought content normal.     Wt Readings from Last 3 Encounters:  08/16/21 253 lb 3.2 oz (114.9 kg)  01/11/21 272 lb (123.4 kg)  01/10/21 260 lb (117.9 kg)  There were no vitals taken for this visit.  Assessment and Plan:

## 2022-11-20 ENCOUNTER — Encounter: Payer: 59 | Admitting: Internal Medicine

## 2022-11-25 ENCOUNTER — Encounter: Payer: 59 | Admitting: Internal Medicine

## 2023-03-02 ENCOUNTER — Encounter: Payer: 59 | Admitting: Internal Medicine

## 2023-03-02 NOTE — Assessment & Plan Note (Deleted)
Stable symptoms followed by Dermatology

## 2023-03-02 NOTE — Assessment & Plan Note (Deleted)
  LDL is  Lab Results  Component Value Date   LDLCALC 108 (H) 08/16/2021  On diet only.

## 2023-03-02 NOTE — Progress Notes (Deleted)
Date:  03/02/2023   Name:  Travis Vargas.   DOB:  December 09, 1995   MRN:  [id number]   Chief Complaint: No chief complaint on file. Travis Vargas. is a 27 y.o. male who presents today for his Complete Annual Exam. He feels {DESC; WELL/FAIRLY WELL/POORLY:18703}. He reports exercising ***. He reports he is sleeping {DESC; WELL/FAIRLY WELL/POORLY:18703}.     Immunization History  Administered Date(s) Administered   Influenza, Quadrivalent, Recombinant, Inj, Pf 09/27/2019   Influenza,inj,Quad PF,6+ Mos 08/13/2020, 08/16/2021   PFIZER(Purple Top)SARS-COV-2 Vaccination 02/03/2020, 02/24/2020   Health Maintenance Due  Topic Date Due   HIV Screening  Never done   DTaP/Tdap/Td (1 - Tdap) Never done   COVID-19 Vaccine (3 - 2023-24 season) 07/18/2022    No results found for: "PSA1", "PSA"    HPI  Lab Results  Component Value Date   NA 137 08/16/2021   K 4.2 08/16/2021   CO2 23 08/16/2021   GLUCOSE 56 (L) 08/16/2021   BUN 12 08/16/2021   CREATININE 1.23 08/16/2021   CALCIUM 9.3 08/16/2021   EGFR 83 08/16/2021   GFRNONAA 105 08/13/2020   Lab Results  Component Value Date   CHOL 154 08/16/2021   HDL 26 (L) 08/16/2021   LDLCALC 108 (H) 08/16/2021   TRIG 109 08/16/2021   CHOLHDL 5.9 (H) 08/16/2021   No results found for: "TSH" No results found for: "HGBA1C" Lab Results  Component Value Date   WBC 4.6 08/16/2021   HGB 15.8 08/16/2021   HCT 46.0 08/16/2021   MCV 90 08/16/2021   PLT 275 08/16/2021   Lab Results  Component Value Date   ALT 26 08/16/2021   AST 17 08/16/2021   ALKPHOS 78 08/16/2021   BILITOT 0.4 08/16/2021   No results found for: "25OHVITD2", "25OHVITD3", "VD25OH"   Review of Systems  Patient Active Problem List   Diagnosis Date Noted   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  Social History   Tobacco Use   Smoking status: Never   Smokeless tobacco: Never  Vaping Use   Vaping Use: Never used  Substance Use Topics   Alcohol use: Yes    Comment: social   Drug use: No     Medication list has been reviewed and updated.  No outpatient medications have been marked as taking for the 03/02/23 encounter (Appointment) with Reubin Milan, MD.       08/16/2021    8:10 AM 01/11/2021    8:48 AM 11/27/2020    2:35 PM  GAD 7 : Generalized Anxiety Score  Nervous, Anxious, on Edge 0 0 0  Control/stop worrying 0 0 0  Worry too much - different things 0 0 0  Trouble relaxing 0 0 0  Restless 0 0 0  Easily annoyed or irritable 0 0 0  Afraid - awful might happen 0 0 0  Total GAD 7 Score 0 0 0  Anxiety Difficulty Not difficult at all         08/16/2021    8:10 AM 01/11/2021    8:48 AM 11/27/2020    2:35 PM  Depression screen PHQ 2/9  Decreased Interest 0 0 0  Down, Depressed, Hopeless 0 0 0  PHQ - 2 Score 0 0 0  Altered sleeping 0 0 0  Tired, decreased energy 0 0 0  Change in appetite 0 0 0  Feeling bad or failure about yourself  0 0 0  Trouble concentrating 0 0 0  Moving slowly or fidgety/restless 0 0 0  Suicidal thoughts 0 0 0  PHQ-9 Score 0 0 0  Difficult doing work/chores Not difficult at all      BP Readings from Last 3 Encounters:  08/16/21 104/62  01/11/21 108/82  01/10/21 119/82    Physical Exam  Wt Readings from Last 3 Encounters:  08/16/21 253 lb 3.2 oz (114.9 kg)  01/11/21 272 lb (123.4 kg)  01/10/21 260 lb (117.9 kg)    There were no vitals taken for this visit.  Assessment and Plan:  Problem List Items Addressed This Visit   None   No follow-ups on file.   Partially dictated using Dragon software, any errors are not intentional.  Reubin Milan, MD [facility] [place], [place]

## 2023-03-03 ENCOUNTER — Encounter: Payer: Self-pay | Admitting: Internal Medicine
